# Patient Record
Sex: Female | Born: 1965
Health system: Southern US, Community
[De-identification: ages and names within clinical notes are randomized; demographics above are authoritative.]

## PROBLEM LIST (undated history)

## (undated) ENCOUNTER — Ambulatory Visit: Admission: EM | Discharge status: Absent without leave | Payer: BC Managed Care – PPO

## (undated) DIAGNOSIS — M1990 Unspecified osteoarthritis, unspecified site: Secondary | ICD-10-CM

## (undated) DIAGNOSIS — Z9889 Other specified postprocedural states: Secondary | ICD-10-CM

## (undated) DIAGNOSIS — G43909 Migraine, unspecified, not intractable, without status migrainosus: Secondary | ICD-10-CM

## (undated) HISTORY — PX: ABDOMINAL HYSTERECTOMY: SHX81

## (undated) HISTORY — PX: ABLATION: SHX5711

## (undated) HISTORY — PX: KNEE ARTHROSCOPY: SUR90

---

## 2009-03-04 ENCOUNTER — Other Ambulatory Visit: Admission: RE | Admit: 2009-03-04 | Discharge: 2009-03-04 | Payer: Self-pay | Admitting: Family Medicine

## 2010-06-23 ENCOUNTER — Ambulatory Visit (HOSPITAL_COMMUNITY): Admission: RE | Admit: 2010-06-23 | Discharge: 2010-06-23 | Payer: Self-pay | Admitting: Obstetrics and Gynecology

## 2011-01-27 LAB — COMPREHENSIVE METABOLIC PANEL
BUN: 7 mg/dL (ref 6–23)
CO2: 24 mEq/L (ref 19–32)
Chloride: 108 mEq/L (ref 96–112)
Creatinine, Ser: 0.79 mg/dL (ref 0.4–1.2)
GFR calc non Af Amer: >60 mL/min (ref >60)
Total Bilirubin: 0.5 mg/dL (ref 0.3–1.2)

## 2011-01-27 LAB — CBC
Hemoglobin: 11.6 g/dL — ABNORMAL LOW (ref 12.0–15.0)
MCH: 29.3 pg (ref 26.0–34.0)
MCV: 87.1 fL (ref 78.0–100.0)
RBC: 3.95 MIL/uL (ref 3.87–5.11)

## 2012-11-25 ENCOUNTER — Encounter: Payer: Self-pay | Admitting: Sports Medicine

## 2012-11-25 ENCOUNTER — Ambulatory Visit (INDEPENDENT_AMBULATORY_CARE_PROVIDER_SITE_OTHER): Payer: BC Managed Care – PPO | Admitting: Sports Medicine

## 2012-11-25 VITALS — BP 133/86 | HR 84 | Ht 65.0 in | Wt 170.0 lb

## 2012-11-25 DIAGNOSIS — M2240 Chondromalacia patellae, unspecified knee: Secondary | ICD-10-CM

## 2012-11-25 DIAGNOSIS — M25569 Pain in unspecified knee: Secondary | ICD-10-CM

## 2012-11-25 MED ORDER — TRAMADOL HCL 50 MG PO TABS: 50.0000 mg | ORAL_TABLET | Freq: Two times a day (BID) | ORAL | Status: DC | PRN

## 2012-11-25 MED ORDER — HYDROCODONE-ACETAMINOPHEN 5-500 MG PO TABS: 1.0000 | ORAL_TABLET | Freq: Every evening | ORAL | Status: DC | PRN

## 2012-11-25 NOTE — Progress Notes (Signed)
  Subjective:    Patient ID: Christina Montoya, female    DOB: 11-22-1965, 47 y.o.   MRN: [id number]  HPI chief complaint: Left knee pain  Patient is a 47 year old female whom I followed for years at [facility] for bilateral knee pain. She has a history of bilateral chondromalacia patella/patellofemoral DJD. He's been treated in the past with cortisone injections and Visco supplementation in each knee. Last Supartz injections were in April of this year into the right knee. Today she is complaining of left knee pain. Pain is identical in nature to what she's experienced previously. Worse with going up and down stairs or squatting. She has experienced swelling in the past but denies any recent swelling in either knee. No mechanical symptoms. I've given her prescriptions for both Ultram and Vicodin in the past. She uses these responsibly.  Medications include Zyrtec She is allergic to all NSAIDs, Septra, tetracycline Socially she does not smoke, drinks a glass of wine daily    Review of Systems     Objective:   Physical Exam Well-developed, well-nourished. No acute distress. Awake alert and oriented x3  Left knee: Full range of motion. No effusion. 2-3+ patellofemoral crepitus. Slight tenderness along the medial joint line but negative McMurray's. Positive patellar compression test. Good ligamentous stability. Neurovascularly intact distally. Right knee: Full range of motion. No effusion. Trace patellofemoral crepitus. Slight tenderness along the medial joint line but negative McMurray's. Mildly positive patellar compression test. Good ligamentous stability. Neurovascularly intact distally. She walks without a significant limp.      Assessment & Plan:  1. Left knee pain secondary to chondromalacia patella/patellofemoral DJD  Although the patient has done well with Visco supplementation in the past I informed her that her insurance will likely not cover any further injections.  This is do to a recent position statement by the [employer] recommending against Visco supplementation for treatment of osteoarthritis. Therefore, I have recommended a cortisone injection. I've also given her a prescription for Ultram 50 mg to take twice a day when necessary #60 with one refill and a prescription for Vicodin 5/500 to take for more severe pain, #40 with no refills. She will contact [employer] to see if further Visco supplementation will be covered. Followup with me when necessary  Consent obtained and verified. Time-out conducted. Noted no overlying erythema, induration, or other signs of local infection. Skin prepped in a sterile fashion. Topical analgesic spray: Ethyl chloride. Joint: left knee, anteriolateral approach Needle: 25g 1 1/2 inch Completed without difficulty. Meds: 3cc 0.5% marcaine, 1cc (40mg ) depomedrol  Advised to call if fevers/chills, erythema, induration, drainage, or persistent bleeding.

## 2013-01-01 ENCOUNTER — Ambulatory Visit (INDEPENDENT_AMBULATORY_CARE_PROVIDER_SITE_OTHER): Payer: BC Managed Care – PPO | Admitting: Sports Medicine

## 2013-01-01 ENCOUNTER — Other Ambulatory Visit: Payer: Self-pay | Admitting: Sports Medicine

## 2013-01-01 ENCOUNTER — Ambulatory Visit
Admission: RE | Admit: 2013-01-01 | Discharge: 2013-01-01 | Disposition: A | Discharge status: Home / Self Care | Payer: BC Managed Care – PPO | Source: Ambulatory Visit | Attending: Sports Medicine | Admitting: Sports Medicine

## 2013-01-01 ENCOUNTER — Encounter: Payer: Self-pay | Admitting: Sports Medicine

## 2013-01-01 VITALS — BP 137/92 | HR 89 | Ht 65.0 in | Wt 170.0 lb

## 2013-01-01 DIAGNOSIS — IMO0002 Reserved for concepts with insufficient information to code with codable children: Secondary | ICD-10-CM

## 2013-01-01 DIAGNOSIS — M25569 Pain in unspecified knee: Secondary | ICD-10-CM

## 2013-01-01 DIAGNOSIS — M1710 Unilateral primary osteoarthritis, unspecified knee: Secondary | ICD-10-CM

## 2013-01-01 NOTE — Progress Notes (Signed)
  Subjective:    Patient ID: Christina Montoya, female    DOB: 1966/02/13, 47 y.o.   MRN: [id number]  HPI  Patient comes in today with returning bilateral knee pain, left greater than right. Well documented history of chondromalacia patella/patellofemoral DJD. Symptoms began to return 3 weeks ago but her a little bit different in nature than what she is experienced previously. She is describing a burning discomfort that is in the retropatellar area and is present both with sitting as well as standing. No swelling. We have discussed the possibility of repeat Visco supplementation at her last office visit. She is interested in pursuing this. She has a history of bilateral lateral releases done by Dr. Thurston Hole in 2005. He was told at that time that she may eventually need patellofemoral arthroplasty. She is currently taking tramadol and hydrocodone as needed for pain but neither a very helpful. She is unable to take NSAIDs.  Interim medical history is unchanged since her last office visit    Review of Systems     Objective:   Physical Exam Well-developed, well-nourished. No acute distress. Awake alert and oriented x3  Left knee: Full range of motion. No effusion. 2+ patellofemoral crepitus with positive patellofemoral compression test. Negative McMurray's. Knee is stable to ligamentous exam. Neurovascularly intact distally.  Right knee: Full range of motion. No effusion. 2+ patellofemoral crepitus with positive patellofemoral compression test. Negative McMurray's. Knee is stable to ligamentous exam. Neurovascularly intact distally.  Walking with a slight limp      Assessment & Plan:  1. Bilateral knee pain, left greater than right secondary to chondromalacia patella/patellofemoral DJD  Patient was sent for updated x-rays of each knee. Followup next week to start another round of Visco supplementation.

## 2013-01-06 ENCOUNTER — Ambulatory Visit (INDEPENDENT_AMBULATORY_CARE_PROVIDER_SITE_OTHER): Payer: BC Managed Care – PPO | Admitting: Sports Medicine

## 2013-01-06 ENCOUNTER — Encounter: Payer: Self-pay | Admitting: Sports Medicine

## 2013-01-06 VITALS — BP 133/83 | HR 76 | Ht 65.0 in | Wt 170.0 lb

## 2013-01-06 DIAGNOSIS — IMO0002 Reserved for concepts with insufficient information to code with codable children: Secondary | ICD-10-CM

## 2013-01-06 DIAGNOSIS — M179 Osteoarthritis of knee, unspecified: Secondary | ICD-10-CM

## 2013-01-06 DIAGNOSIS — M2240 Chondromalacia patellae, unspecified knee: Secondary | ICD-10-CM

## 2013-01-06 DIAGNOSIS — M1710 Unilateral primary osteoarthritis, unspecified knee: Secondary | ICD-10-CM

## 2013-01-06 MED ORDER — CHLORZOXAZONE 500 MG PO TABS: 500.0000 mg | ORAL_TABLET | Freq: Three times a day (TID) | ORAL | Status: DC | PRN

## 2013-01-06 NOTE — Progress Notes (Signed)
  Subjective:    Patient ID: Christina Montoya, female    DOB: Mar 11, 1966, 48 y.o.   MRN: [id number]  HPI Patient comes in today to start a round of Supartz in her left knee. Recent x-rays of each of her knees showed some mild degenerative changes. She has done well with Supartz in the past.    Review of Systems     Objective:   Physical Exam Unchanged from her exam last week  X-rays of each of her knees are reviewed independently by myself. She has some mild medial compartmental narrowing as well as a slight lateral tilt of the patella. Findings are consistent with mild DJD.       Assessment & Plan:  1. Returning left knee pain secondary to DJD/chondromalacia patella  Patient's left knee is injected with her first Supartz injection. It is done using an anterior medial approach. She will followup next week for her second injection.  Of note, patient was complaining of a little bit of pain and spasm in the left trapezius. She's had a similar injury in the past which responded well to chlorzaxazone. I've given her a refill with instructions to take it 3 times a day when necessary.

## 2013-01-13 ENCOUNTER — Ambulatory Visit (INDEPENDENT_AMBULATORY_CARE_PROVIDER_SITE_OTHER): Payer: BC Managed Care – PPO | Admitting: Sports Medicine

## 2013-01-13 VITALS — BP 120/70 | Ht 65.0 in | Wt 170.0 lb

## 2013-01-13 DIAGNOSIS — M25562 Pain in left knee: Secondary | ICD-10-CM

## 2013-01-13 DIAGNOSIS — M25561 Pain in right knee: Secondary | ICD-10-CM

## 2013-01-13 DIAGNOSIS — IMO0002 Reserved for concepts with insufficient information to code with codable children: Secondary | ICD-10-CM

## 2013-01-13 DIAGNOSIS — M2240 Chondromalacia patellae, unspecified knee: Secondary | ICD-10-CM

## 2013-01-13 DIAGNOSIS — M1710 Unilateral primary osteoarthritis, unspecified knee: Secondary | ICD-10-CM

## 2013-01-13 DIAGNOSIS — M25569 Pain in unspecified knee: Secondary | ICD-10-CM

## 2013-01-13 DIAGNOSIS — M1712 Unilateral primary osteoarthritis, left knee: Secondary | ICD-10-CM

## 2013-01-14 NOTE — Progress Notes (Deleted)
  Subjective:    Patient ID: Christina Montoya, female    DOB: April 17, 1966, 47 y.o.   MRN: [id number]  HPI    Review of Systems     Objective:   Physical Exam        Assessment & Plan:

## 2013-01-14 NOTE — Progress Notes (Signed)
Patient ID: [name], female   DOB: May 31, 1966, 47 y.o.   MRN: [id number]   Christina Montoya comes in today for her second Supartz injection into her left knee. She tolerated the first one without any difficulty. She tells me that she has scheduled followup visits for her right knee as well so that we can complete the Supartz injection series for that knee in addition to starting the series on the left. Today however, we are only injecting the left knee.  Left knee exam is unchanged from last week's visit.  Impression/plan: 1. Left knee chondromalacia patella/mild DJD  Patient's left knee is injected with her second Supartz injection. This is done using an anterior medial approach. She tolerated this without difficulty. Followup next week for her third injection in this knee. Followup as scheduled to continue Supartz injections in the right knee as well. She would also like to try a body helix compression sleeve with prolonged walking. She has a double upright brace at home which is helpful but quite bulky.  Consent obtained and verified. Time-out conducted. Noted no overlying erythema, induration, or other signs of local infection. Skin prepped in a sterile fashion. Topical analgesic spray: Ethyl chloride. Joint: left knee Needle: 22g 11/2 inch Completed without difficulty. Meds: 3cc 1% lidocine followed by 1 vial of supartz  Advised to call if fevers/chills, erythema, induration, drainage, or persistent bleeding.

## 2013-01-20 ENCOUNTER — Encounter: Payer: Self-pay | Admitting: Sports Medicine

## 2013-01-20 ENCOUNTER — Ambulatory Visit (INDEPENDENT_AMBULATORY_CARE_PROVIDER_SITE_OTHER): Payer: BC Managed Care – PPO | Admitting: Sports Medicine

## 2013-01-20 ENCOUNTER — Ambulatory Visit: Payer: BC Managed Care – PPO | Admitting: Sports Medicine

## 2013-01-20 DIAGNOSIS — M2240 Chondromalacia patellae, unspecified knee: Secondary | ICD-10-CM

## 2013-01-20 DIAGNOSIS — M19049 Primary osteoarthritis, unspecified hand: Secondary | ICD-10-CM

## 2013-01-21 NOTE — Progress Notes (Signed)
Patient ID: Christina Montoya, female   DOB: [birth year], 46 y.o.   MRN: [id number]  Patient comes in today for her third Supartz injection into her left knee. She is are you noticing symptom relief. She voices no concerns.  Is good examination of the left knee is unchanged from last week's visit  Impression/plan:  1. Left knee pain secondary to chondromalacia patella/patellofemoral DJD  Patient's left knee is injected with her third Supartz injection. This was done atraumatically under sterile technique utilizing an anterior medial approach. She tolerated this without difficulty. She is scheduled to return to the office next week to start a round of Supartz in the right knee.  Consent obtained and verified. Time-out conducted. Noted no overlying erythema, induration, or other signs of local infection. Skin prepped in a sterile fashion. Topical analgesic spray: Ethyl chloride. Joint: left knee Needle: 25g 11/2 inch   Completed without difficulty. Meds: 3cc 1% lidocaine, 1cc (40mg ) depomedrol  Advised to call if fevers/chills, erythema, induration, drainage, or persistent bleeding.

## 2013-01-27 ENCOUNTER — Ambulatory Visit (INDEPENDENT_AMBULATORY_CARE_PROVIDER_SITE_OTHER): Payer: BC Managed Care – PPO | Admitting: Sports Medicine

## 2013-01-27 ENCOUNTER — Encounter: Payer: Self-pay | Admitting: Sports Medicine

## 2013-01-27 ENCOUNTER — Ambulatory Visit: Payer: BC Managed Care – PPO | Admitting: Sports Medicine

## 2013-01-27 VITALS — BP 134/70 | Ht 65.0 in | Wt 170.0 lb

## 2013-01-27 DIAGNOSIS — IMO0002 Reserved for concepts with insufficient information to code with codable children: Secondary | ICD-10-CM

## 2013-01-27 DIAGNOSIS — M2240 Chondromalacia patellae, unspecified knee: Secondary | ICD-10-CM

## 2013-01-27 DIAGNOSIS — M1710 Unilateral primary osteoarthritis, unspecified knee: Secondary | ICD-10-CM

## 2013-01-27 DIAGNOSIS — M1711 Unilateral primary osteoarthritis, right knee: Secondary | ICD-10-CM

## 2013-01-27 NOTE — Progress Notes (Signed)
  Subjective:    Patient ID: Christina Montoya, female    DOB: 1965/11/27, 47 y.o.   MRN: [id number]  HPI chief complaint: Right knee pain  Patient comes in today complaining of right knee pain. She has a history of mild DJD in patellofemoral chondromalacia. Her pain is diffuse and worse with activity such as going up and downstairs or squatting. No recent trauma. She has had good success with Visco supplementation in the past. We most recently injected her knee with cortisone which did temporarily help. Recent x-rays done in February showed some mild sclerosis of the medial tibia and lateral patella consistent with mild DJD. She has just completed a 3 shot Supartz series in the left knee and is doing well.    Review of Systems     Objective:   Physical Exam Well-developed, well-nourished. No acute distress. Awake alert and oriented x3  Right knee: Full range of motion. No effusion. Positive patellofemoral crepitus with a positive patellar compression test. Mild tenderness along the medial joint line but a negative McMurray's. No tenderness along the lateral joint line. Neurovascularly intact distally. Walking with out a limp       Assessment & Plan:  1. Returning right knee pain secondary to patellofemoral DJD/chondromalacia patella  Patient's right knee was injected with her first Supartz injection. This was done using an anterior medial approach. She tolerated this without difficulty. Followup next week for her second injection. Call with questions or concerns in the interim.  Consent obtained and verified. Time-out conducted. Noted no overlying erythema, induration, or other signs of local infection. Skin prepped in a sterile fashion. Topical analgesic spray: Ethyl chloride. Joint: Right knee Needle: 22g 11/2 inch Completed without difficulty. Meds: 3cc 1%lidocaine followed by 1 vial of supartz  Advised to call if fevers/chills, erythema, induration, drainage, or persistent  bleeding.

## 2013-02-03 ENCOUNTER — Ambulatory Visit (INDEPENDENT_AMBULATORY_CARE_PROVIDER_SITE_OTHER): Payer: BC Managed Care – PPO | Admitting: Sports Medicine

## 2013-02-03 ENCOUNTER — Ambulatory Visit: Payer: BC Managed Care – PPO | Admitting: Sports Medicine

## 2013-02-03 VITALS — BP 124/80 | Ht 65.0 in | Wt 170.0 lb

## 2013-02-03 DIAGNOSIS — M2240 Chondromalacia patellae, unspecified knee: Secondary | ICD-10-CM

## 2013-02-03 DIAGNOSIS — M1710 Unilateral primary osteoarthritis, unspecified knee: Secondary | ICD-10-CM

## 2013-02-03 DIAGNOSIS — IMO0002 Reserved for concepts with insufficient information to code with codable children: Secondary | ICD-10-CM

## 2013-02-03 NOTE — Progress Notes (Signed)
Patient ID: DONIQUE HAMMONDS, female   DOB: Apr 06, 1966, 47 y.o.   MRN: [id number]  Patient comes in today for her second Supartz injection into her right knee. She tolerated the first one without difficulty.  Physical exam is unchanged from last week's exam   Impression: Right knee pain secondary to mild DJD/chondromalacia patella  Patient's knee is injected with her second Supartz injection. This is done atraumatically under sterile technique utilizing an anterior medial approach. She tolerated this without difficulty. She'll followup next week for her third injection.  Consent obtained and verified. Time-out conducted. Noted no overlying erythema, induration, or other signs of local infection. Skin prepped in a sterile fashion. Topical analgesic spray: Ethyl chloride. Joint: right knee Needle: 22g 11/2 inch Completed without difficulty. Meds: 3cc 1%lidocaine followed by 1 vial of supartz  Advised to call if fevers/chills, erythema, induration, drainage, or persistent bleeding.

## 2013-02-10 ENCOUNTER — Ambulatory Visit (INDEPENDENT_AMBULATORY_CARE_PROVIDER_SITE_OTHER): Payer: BC Managed Care – PPO | Admitting: Sports Medicine

## 2013-02-10 ENCOUNTER — Encounter: Payer: Self-pay | Admitting: Sports Medicine

## 2013-02-10 DIAGNOSIS — M1711 Unilateral primary osteoarthritis, right knee: Secondary | ICD-10-CM

## 2013-02-10 DIAGNOSIS — M2240 Chondromalacia patellae, unspecified knee: Secondary | ICD-10-CM

## 2013-02-10 DIAGNOSIS — IMO0002 Reserved for concepts with insufficient information to code with codable children: Secondary | ICD-10-CM

## 2013-02-10 DIAGNOSIS — M1710 Unilateral primary osteoarthritis, unspecified knee: Secondary | ICD-10-CM

## 2013-02-10 NOTE — Progress Notes (Signed)
Patient ID: OMOLARA CAROL, female   DOB: Jan 02, 1966, 47 y.o.   MRN: [id number]  Patient comes in today for her third Supartz injection into her right knee. Tolerated the first 2 without any difficulty. Right knee pain is beginning to subside. She has no new complaints.  Physical exam is unchanged from previous visit.  Impression/plan  1. Right knee pain secondary to chondromalacia patella/mild DJD  Patient's knee is injected with her third Supartz injection. This is done utilizing an anterior medial approach. Patient can resume activity without restrictions and will followup when necessary.  Consent obtained and verified. Time-out conducted. Noted no overlying erythema, induration, or other signs of local infection. Skin prepped in a sterile fashion. Topical analgesic spray: Ethyl chloride. Joint: right knee Needle: 22g 11/2 inch Completed without difficulty. Meds: 3cc 1% lidocaine followed by 1 vial of supartz  Advised to call if fevers/chills, erythema, induration, drainage, or persistent bleeding.

## 2013-05-26 ENCOUNTER — Ambulatory Visit (INDEPENDENT_AMBULATORY_CARE_PROVIDER_SITE_OTHER): Payer: BC Managed Care – PPO | Admitting: Sports Medicine

## 2013-05-26 ENCOUNTER — Encounter: Payer: Self-pay | Admitting: Sports Medicine

## 2013-05-26 VITALS — BP 151/84 | HR 73 | Ht 65.0 in | Wt 165.0 lb

## 2013-05-26 DIAGNOSIS — M7050 Other bursitis of knee, unspecified knee: Secondary | ICD-10-CM

## 2013-05-26 DIAGNOSIS — M25569 Pain in unspecified knee: Secondary | ICD-10-CM

## 2013-05-26 DIAGNOSIS — M25562 Pain in left knee: Secondary | ICD-10-CM

## 2013-05-26 DIAGNOSIS — IMO0002 Reserved for concepts with insufficient information to code with codable children: Secondary | ICD-10-CM

## 2013-05-26 MED ORDER — METHYLPREDNISOLONE ACETATE 40 MG/ML IJ SUSP
40.0000 mg | Freq: Once | INTRAMUSCULAR | Status: AC
  Administered 2013-05-26: 40 mg via INTRA_ARTICULAR

## 2013-05-26 NOTE — Progress Notes (Signed)
  Subjective:    Patient ID: Christina Montoya, female    DOB: [birth year], 47 y.o.   MRN: [id number]  HPI chief complaint: Left knee pain  Patient comes in today complaining of 2 months of intermittent medial sided knee pain. She has a history of chondromalacia patella/patellofemoral DJD which is responded well in the past to Supartz injections. However, her current pain is different in nature to what she experienced previously. She denies any trauma but rather describes an insidious onset of pain and swelling that she localizes over the pes anserine bursa. Symptoms are worse with activity and improve at rest. No fevers or chills. She has not noticed any joint effusions. Denies any increase in activity. She has been wearing her body helix compression sleeve which does seem to help.  Interim medical history is unchanged    Review of Systems     Objective:   Physical Exam Well-developed, well-nourished. No acute distress. Awake alert and oriented x3  Left knee: Full range of motion. No effusion. There is tenderness as well as some mild swelling over the pes anserine bursa. No joint line tenderness to palpation. Negative McMurray's. Good joint stability. Neurovascularly intact distally. Walking with only a slight limp.       Assessment & Plan:  1. Left knee pain secondary to pes anserine bursitis  Patient's left pes anserine bursa was injected today after consent was obtained and risks including the risk of hypopigmentation were explained. She tolerated this without difficulty. I've given her a home exercise program. She will use ice daily. She has a body helix compression sleeve to wear as well. If symptoms persist I would consider merits of further diagnostic imaging. Followup for ongoing or recalcitrant issues.  Consent obtained and verified. Time-out conducted. Noted no overlying erythema, induration, or other signs of local infection. Skin prepped in a sterile fashion. Topical  analgesic spray: Ethyl chloride. Joint: left pes anserine bursa Needle: 25g 1 1/2 inch Completed without difficulty. Meds: 1cc (40mg ) depomedrol, 3cc 1% Xylocaine  Advised to call if fevers/chills, erythema, induration, drainage, or persistent bleeding.

## 2013-05-27 ENCOUNTER — Other Ambulatory Visit: Payer: Self-pay | Admitting: *Deleted

## 2013-05-27 MED ORDER — HYDROCODONE-ACETAMINOPHEN 5-500 MG PO TABS: 1.0000 | ORAL_TABLET | Freq: Four times a day (QID) | ORAL | Status: DC | PRN

## 2013-06-02 ENCOUNTER — Telehealth: Payer: Self-pay | Admitting: Sports Medicine

## 2013-06-02 ENCOUNTER — Other Ambulatory Visit: Payer: Self-pay | Admitting: *Deleted

## 2013-06-02 DIAGNOSIS — M25562 Pain in left knee: Secondary | ICD-10-CM

## 2013-06-02 NOTE — Telephone Encounter (Signed)
Message copied by Ralene Cork on Mon Jun 02, 2013 10:46 AM ------      Message from: Claiborne Billings      Created: Mon Jun 02, 2013  8:50 AM      Contact: [phone]       Still having complications with the left knee from last week.  Please call ------

## 2013-06-02 NOTE — Telephone Encounter (Signed)
Spoke with patient about her ongoing knee pain. Pes anserine bursa injection did not help and she is still struggling with pain and swelling in the medial knee. I think we should pursue an MRI of this knee before further treatment. In the meantime, I agreed to call her in a few hydrocodone last week to take for pain. I will f/u with her via telephone once I've reviewed her MRI.

## 2013-06-04 ENCOUNTER — Other Ambulatory Visit: Payer: BC Managed Care – PPO

## 2013-06-05 ENCOUNTER — Ambulatory Visit: Payer: BC Managed Care – PPO | Admitting: Sports Medicine

## 2013-08-18 ENCOUNTER — Ambulatory Visit (INDEPENDENT_AMBULATORY_CARE_PROVIDER_SITE_OTHER): Payer: BC Managed Care – PPO | Admitting: Sports Medicine

## 2013-08-18 VITALS — BP 147/89 | Ht 65.0 in | Wt 165.0 lb

## 2013-08-18 DIAGNOSIS — M25562 Pain in left knee: Secondary | ICD-10-CM

## 2013-08-18 DIAGNOSIS — M25569 Pain in unspecified knee: Secondary | ICD-10-CM

## 2013-08-18 MED ORDER — TRAMADOL HCL 50 MG PO TABS: 50.0000 mg | ORAL_TABLET | Freq: Two times a day (BID) | ORAL | Status: DC | PRN

## 2013-08-19 NOTE — Progress Notes (Signed)
  Subjective:    Patient ID: Christina Montoya, female    DOB: 09-04-1966, 47 y.o.   MRN: [id number]  HPI Patient comes in today with left knee pain. She was last seen in the office a few weeks ago where she was diagnosed with pes anserine bursitis. A cortisone injection administered at that time was not helpful. She then saw Dr. Thurston Hole who diagnosed her with insertional hamstring tendinitis and injected the area of the semi-tendinosis with cortisone. After a cortisone flare her pain did improve. Today's pain is mainly along her medial joint line. She has been trying some topical Voltaren which has been helpful. She's taking tramadol when necessary for pain which seems to be working as well. 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 She denies any swelling. No recent trauma. No real mechanical symptoms.    Review of Systems     Objective:   Physical Exam Well-developed, well-nourished. No acute distress  Left knee: Full range of motion. No effusion. There is tenderness to palpation along the medial joint line with pain but no popping with McMurray's maneuver. Minimal tenderness at the pes anserine bursa. Good joint stability. Neurovascularly intact distally. Walking with a minimal limp.       Assessment & Plan:  Left knee pain likely secondary to DJD/chondromalacia  I have recommended that for the time being the patient's take with her topical Voltaren and I will refill her Ultram to take as needed for more severe pain. I don't mind giving her a temporary three-month handicap placard and I'll plan on seeing her back in 3 months. We did discuss the possibility of an MRI a few weeks ago when she was having persistent left knee pain but at this point in time I think that we can wait on that. She will call me with questions or concerns prior to her followup visit.

## 2013-08-25 ENCOUNTER — Ambulatory Visit: Payer: BC Managed Care – PPO | Admitting: Sports Medicine

## 2013-09-08 ENCOUNTER — Other Ambulatory Visit: Payer: Self-pay | Admitting: *Deleted

## 2013-09-08 MED ORDER — CHLORZOXAZONE 500 MG PO TABS: 500.0000 mg | ORAL_TABLET | Freq: Three times a day (TID) | ORAL | Status: DC | PRN

## 2014-01-21 ENCOUNTER — Encounter: Payer: Self-pay | Admitting: Sports Medicine

## 2014-01-21 ENCOUNTER — Ambulatory Visit (INDEPENDENT_AMBULATORY_CARE_PROVIDER_SITE_OTHER): Payer: BC Managed Care – PPO | Admitting: Sports Medicine

## 2014-01-21 VITALS — BP 130/87 | Ht 65.0 in | Wt 158.0 lb

## 2014-01-21 DIAGNOSIS — M1710 Unilateral primary osteoarthritis, unspecified knee: Secondary | ICD-10-CM

## 2014-01-21 DIAGNOSIS — IMO0002 Reserved for concepts with insufficient information to code with codable children: Secondary | ICD-10-CM

## 2014-01-21 DIAGNOSIS — M2240 Chondromalacia patellae, unspecified knee: Secondary | ICD-10-CM

## 2014-01-21 DIAGNOSIS — M179 Osteoarthritis of knee, unspecified: Secondary | ICD-10-CM

## 2014-01-21 MED ORDER — CHLORZOXAZONE 500 MG PO TABS: 500.0000 mg | ORAL_TABLET | Freq: Three times a day (TID) | ORAL | Status: DC | PRN

## 2014-01-21 MED ORDER — TRAMADOL HCL 50 MG PO TABS: 50.0000 mg | ORAL_TABLET | Freq: Two times a day (BID) | ORAL | Status: DC | PRN

## 2014-01-21 NOTE — Progress Notes (Signed)
   Subjective:    Patient ID: Christina BirchwoodKathleen A Zulauf, female    DOB: 09/28/1966, 48 y.o.   MRN: [id number]  HPI Patient comes in today for followup on bilateral knee pain. Overall, she is doing very well. She has a documented history of bilateral chondromalacia patella/ patellofemoral DJD. She has had several cortisone injections and several rounds of Visco supplementation. Main reason for today's visit is medication refills. She takes tramadol, hydrocodone, and parafon on a when necessary basis. It has been several months since her last refills. She has become active in the gym. Today, she is without pain. Overall, she has lost about 70 pounds through diet and exercise.  Interim medical history is unchanged    Review of Systems     Objective:   Physical Exam Well-developed, well-nourished. No acute distress. Awake alert and oriented x3. Vital signs are reviewed  Examination of each knee shows full range of motion. One to 2+ patellofemoral crepitus bilaterally. No effusion. There is no tenderness to palpation. Negative McMurray's. Knees are stable to ligamentous exam. Neurovascularly intact distally.       Assessment & Plan:  Stable bilateral chondromalacia patella/ patellofemoral DJD  Patient is congratulated on her 70 pound weight loss. I refilled both her tramadol and her parafon. She does not currently need a refill on her hydrocodone. She will continue with her current workout program but she is cautioned about doing lunges and deep squatting. I've also given her some hip strengthening exercises to incorporate into her workout routine. Followup when necessary.

## 2014-02-03 ENCOUNTER — Other Ambulatory Visit: Payer: Self-pay | Admitting: *Deleted

## 2014-02-03 MED ORDER — HYDROCODONE-ACETAMINOPHEN 5-325 MG PO TABS: 1.0000 | ORAL_TABLET | Freq: Four times a day (QID) | ORAL | Status: DC | PRN

## 2014-02-03 MED ORDER — HYDROCODONE-ACETAMINOPHEN 5-325 MG PO TABS: 1.0000 | ORAL_TABLET | Freq: Two times a day (BID) | ORAL | Status: DC | PRN

## 2014-02-03 MED ORDER — HYDROCODONE-ACETAMINOPHEN 5-500 MG PO TABS: 1.0000 | ORAL_TABLET | Freq: Four times a day (QID) | ORAL | Status: DC | PRN

## 2014-02-03 NOTE — Progress Notes (Signed)
Rx re-written- patient only received one prescription for hydrocodone.

## 2014-06-18 ENCOUNTER — Other Ambulatory Visit: Payer: Self-pay | Admitting: *Deleted

## 2014-06-18 DIAGNOSIS — M25562 Pain in left knee: Principal | ICD-10-CM

## 2014-06-18 DIAGNOSIS — M25561 Pain in right knee: Secondary | ICD-10-CM

## 2014-06-18 MED ORDER — HYDROCODONE-ACETAMINOPHEN 5-325 MG PO TABS: 1.0000 | ORAL_TABLET | Freq: Two times a day (BID) | ORAL | Status: DC | PRN

## 2014-09-07 ENCOUNTER — Other Ambulatory Visit: Payer: Self-pay | Admitting: *Deleted

## 2014-09-07 MED ORDER — TRAMADOL HCL 50 MG PO TABS: 50.0000 mg | ORAL_TABLET | Freq: Two times a day (BID) | ORAL | Status: DC | PRN

## 2015-01-04 ENCOUNTER — Ambulatory Visit (INDEPENDENT_AMBULATORY_CARE_PROVIDER_SITE_OTHER): Payer: BLUE CROSS/BLUE SHIELD | Admitting: Sports Medicine

## 2015-01-04 ENCOUNTER — Encounter: Payer: Self-pay | Admitting: Sports Medicine

## 2015-01-04 VITALS — BP 147/81 | HR 76 | Ht 65.0 in | Wt 150.0 lb

## 2015-01-04 DIAGNOSIS — S93401A Sprain of unspecified ligament of right ankle, initial encounter: Secondary | ICD-10-CM | POA: Diagnosis not present

## 2015-01-04 DIAGNOSIS — M1712 Unilateral primary osteoarthritis, left knee: Secondary | ICD-10-CM

## 2015-01-04 DIAGNOSIS — M25562 Pain in left knee: Secondary | ICD-10-CM

## 2015-01-04 MED ORDER — SODIUM HYALURONATE (VISCOSUP) 25 MG/2.5ML IX SOSY
25.0000 mg | PREFILLED_SYRINGE | Freq: Once | INTRA_ARTICULAR | Status: AC
  Administered 2015-01-04: 25 mg via INTRA_ARTICULAR

## 2015-01-04 MED ORDER — TRAMADOL HCL 50 MG PO TABS
50.0000 mg | ORAL_TABLET | Freq: Two times a day (BID) | ORAL | Status: DC | PRN
Start: 2015-01-04 — End: 2015-06-21

## 2015-01-04 MED ORDER — HYDROCODONE-ACETAMINOPHEN 5-325 MG PO TABS
1.0000 | ORAL_TABLET | Freq: Two times a day (BID) | ORAL | Status: DC | PRN
Start: 2015-01-04 — End: 2015-01-26

## 2015-01-04 NOTE — Progress Notes (Signed)
Subjective:    Patient ID: Christina Montoya, female    DOB: 1965-11-26, 49 y.o.   MRN: [id number]  HPI chief complaint: Right ankle and left knee pain  Patient comes in today for repeat Supartz injections into her left knee. She has a history of chondromalacia patella/patellofemoral DJD in this left knee. Her last round of Visco supplementation was 2 years ago. She was doing very well up until a few weeks ago when she suffered an inversion injury to her right ankle. She was seen by Dr. Alfonso Ramus at [facility]. X-rays were negative for fracture per her report. She was initially placed into a Cam Walker and then transitioned into a med spec brace. However she is still limping a bit. She is now favoring the left knee due to her right ankle injury and as a result has aggravated her DJD. Symptoms are identical in nature to what she's experienced previously. She is also requesting refills on her pain medication. She also describes long-standing history of diffuse joint pain in her upper and lower extremities. She was questioning whether or not it was due to her Lasix as she has noted some improvement when she takes furosemide (she takes only on a when necessary basis). She has not noticed any joint swelling. It is primarily pain in her elbows and hands. She has had a rheumatologic workup in the past and many years ago was diagnosed with a dermatological form of lupus. However, she is not on any sort of rheumatologic agent. Interim medical history reviewed Allergies reviewed    Review of Systems     Objective:   Physical Exam Well-developed, well-nourished. No acute distress. Awake alert and oriented 3. Vital signs reviewed.  Left knee: Full range of motion. No effusion. 2+ patellofemoral crepitus. She is tender to palpation along the medial joint line but a negative McMurray's. Good joint stability. Neurovascular intact distally.  Right ankle: Limited range of motion in all planes.  Diffuse tenderness to palpation along the lateral ankle and foot. Neurovascularly intact distally. Walking with a slight limp.       Assessment & Plan:  1. Returning left knee pain secondary to chondromalacia patella/patellofemoral DJD 2. Right ankle sprain 3. Diffuse arthralgias of unknown etiology  Left knee is injected today with her first Supartz injection. This was done atraumatically under sterile technique utilizing an anterior medial approach. Flash of synovial fluid was seen prior to injection. She tolerated this without difficulty. I have refilled both her hydrocodone and tramadol. She takes both sparingly. For her right ankle sprain I have given her a body helix compression sleeve and she will start range of motion exercises at home. We will do some blood work including a CBC, CMP, sedimentation rate, C-reactive protein, ANA, rheumatoid factor, anti-CCP antibody, and HLA-B27. Patient will return to the office in one week for her second Supartz injection. I will schedule her to see Dr. Nori Riis in my absence. Patient will then follow-up with me one week later for her third injection.  Consent obtained and verified. Time-out conducted. Noted no overlying erythema, induration, or other signs of local infection. Skin prepped in a sterile fashion. Topical analgesic spray: Ethyl chloride. Joint: left knee Needle: 22g 1.5 inch Completed without difficulty. Meds: 3 cc 1% xylocaine. Syringe then removed but needle remained in place. One vial of Supartz was then attached to the needle and injected. A flash of synovial fluid was seen prior to injection of viscosupplementation.  Advised to call if fevers/chills, erythema,  induration, drainage, or persistent bleeding.

## 2015-01-05 ENCOUNTER — Other Ambulatory Visit (INDEPENDENT_AMBULATORY_CARE_PROVIDER_SITE_OTHER): Payer: BLUE CROSS/BLUE SHIELD

## 2015-01-05 DIAGNOSIS — M1712 Unilateral primary osteoarthritis, left knee: Secondary | ICD-10-CM

## 2015-01-05 LAB — CBC
HEMATOCRIT: 38.4 % (ref 36.0–46.0)
Hemoglobin: 12.6 g/dL (ref 12.0–15.0)
MCH: 30.4 pg (ref 26.0–34.0)
MCHC: 32.8 g/dL (ref 30.0–36.0)
MCV: 92.8 fL (ref 78.0–100.0)
MPV: 8.7 fL (ref 8.6–12.4)
Platelets: 298 10*3/uL (ref 150–400)
RBC: 4.14 MIL/uL (ref 3.87–5.11)
RDW: 13.2 % (ref 11.5–15.5)
WBC: 5.2 10*3/uL (ref 4.0–10.5)

## 2015-01-05 LAB — RHEUMATOID FACTOR: Rhuematoid fact SerPl-aCnc: <10 IU/mL (ref <=14)

## 2015-01-05 LAB — COMPLETE METABOLIC PANEL WITH GFR
ALBUMIN: 4.5 g/dL (ref 3.5–5.2)
ALT: 12 U/L (ref 0–35)
AST: 19 U/L (ref 0–37)
Alkaline Phosphatase: 48 U/L (ref 39–117)
BUN: 12 mg/dL (ref 6–23)
CHLORIDE: 100 meq/L (ref 96–112)
CO2: 27 mEq/L (ref 19–32)
Calcium: 9.5 mg/dL (ref 8.4–10.5)
Creat: 0.67 mg/dL (ref 0.50–1.10)
GFR, Est African American: >89 mL/min
Glucose, Bld: 77 mg/dL (ref 70–99)
Potassium: 4.3 mEq/L (ref 3.5–5.3)
SODIUM: 137 meq/L (ref 135–145)
Total Bilirubin: 0.5 mg/dL (ref 0.2–1.2)
Total Protein: 7.1 g/dL (ref 6.0–8.3)

## 2015-01-05 LAB — C-REACTIVE PROTEIN

## 2015-01-05 NOTE — Progress Notes (Signed)
[facility]om[facility] drew labs for Dr. Micheline Chapman at [facility]: HLA B-27,  ANA,  RF,  ANTI-CCP,  CBC,  CMET,  ESR,  CRP

## 2015-01-06 LAB — SEDIMENTATION RATE: SED RATE: 4 mm/h (ref 0–20)

## 2015-01-07 LAB — CYCLIC CITRUL PEPTIDE ANTIBODY, IGG: Cyclic Citrullin Peptide Ab: <2 U/mL (ref 0.0–5.0)

## 2015-01-07 LAB — ANA: Anti Nuclear Antibody(ANA): POSITIVE — AB

## 2015-01-07 LAB — ANTI-NUCLEAR AB-TITER (ANA TITER)

## 2015-01-08 LAB — HLA-B27 ANTIGEN: DNA RESULT: NOT DETECTED

## 2015-01-11 ENCOUNTER — Ambulatory Visit (INDEPENDENT_AMBULATORY_CARE_PROVIDER_SITE_OTHER): Payer: BLUE CROSS/BLUE SHIELD | Admitting: Family Medicine

## 2015-01-11 ENCOUNTER — Encounter: Payer: Self-pay | Admitting: Family Medicine

## 2015-01-11 VITALS — BP 140/71 | HR 70 | Ht 65.0 in | Wt 150.0 lb

## 2015-01-11 DIAGNOSIS — M25562 Pain in left knee: Secondary | ICD-10-CM | POA: Diagnosis not present

## 2015-01-11 DIAGNOSIS — M25561 Pain in right knee: Secondary | ICD-10-CM

## 2015-01-11 MED ORDER — SODIUM HYALURONATE (VISCOSUP) 25 MG/2.5ML IX SOSY
2.5000 mL | PREFILLED_SYRINGE | Freq: Once | INTRA_ARTICULAR | Status: AC
  Administered 2015-01-11: 2.5 mL via INTRA_ARTICULAR

## 2015-01-12 NOTE — Progress Notes (Signed)
Patient ID: Christina Montoya, female   DOB: 12/21/1965, 49 y.o.   MRN: [id number]  Christina Montoya - 49 y.o. female MRN [id number]  Date of birth: 09/01/1966    SUBJECTIVE:     Left knee pain. I have not seen her before (Dr. Gilmer Morraper's patient) She is here for second in this series of Supartz injections.  Has had osteoarthritis left knee for several years. Has had a series of Visco supplementation annually for most of the years in the last 3 or 4 years. Each time she gets significant amount of relief. Prior to starting this series, her last Visco supplementation was tears ago. She thinks she probably still would be doing well except she had an inversion injury of her ankle and think she strained her left knee at that time. Last week she had her first in this series of Supartz injection and tolerated it well without any problems. She is already starting to see some improvement, about 20-30%, in her pain and left knee with activity. ROS:     She's had no unusual swelling of the left knee, no erythema or warmth noted. No locking or giving way. She's had no fever, sweats, chills, unusual weight gain.  PERTINENT  PMH / PSH FH / / SH:  Past Medical, Surgical, Social, and Family History Reviewed & Updated in the EMR.  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 No personal history diabetes mellitus or hypertension. She does have history of being treated for anxious depression which seems stable at this time.  IMAGING: Left knee x-ray, 2014. Non-standing but there is a V with 30 flexion. The patellofemoral joint appears to have some arthritic spurring. The tibiofemoral joint is a little bit of sclerosis, the joint space appears well preserved, no cyst formation, very small amount of spurring.  OBJECTIVE: BP 140/71 mmHg  Pulse 70  Ht 5\' 5"  (1.651 m)  Wt 150 lb (68.04 kg)  BMI 24.96 kg/m2  Physical  Exam:  Vital signs are reviewed. GEN.: Well-developed female no acute distress KNEE: Left. Small amount of synovial hypertrophy is noted. She has full range of motion in flexion extension and it is painless. She is ligamentously intact to varus and valgus stress as well as Lockman and anterior drawer. Significant crepitus on extension. Pain with patellar grind. Medial joint line tenderness to palpation. I do not detect significant effusion.  SKIN: There is no erythema or warmth of the joint. Distally she has intact sensation to soft touch. EXT:  The calf is soft. Popliteal space is slightly full but no discrete mass.Marland Kitchen. VASCULAR: Dorsalis pedis pulse 2+. Normal cap refill.  INJECTION: Patient was given informed consent, signed copy in the chart. Appropriate time out was taken. Area prepped and draped in usual sterile fashion. 3 cc of  1% lidocaine without epinephrine was injected into the left knee using a(n) anterior medial approach. Leaving the 21-gauge needle in place in removing the syringe use of the lidocaine with sterile forceps, I then Luer locked the Supartz syringe onto the needle and injected into the knee without problem. ( The patient tolerated the procedure well. There were no complications. Post procedure instructions were given.  ASSESSMENT & PLAN:  See problem based charting & AVS for pt instructions.

## 2015-01-12 NOTE — Assessment & Plan Note (Signed)
Second in this series of Supartz injections performed today F/u 1 week

## 2015-01-18 ENCOUNTER — Encounter: Payer: Self-pay | Admitting: Sports Medicine

## 2015-01-18 ENCOUNTER — Ambulatory Visit (INDEPENDENT_AMBULATORY_CARE_PROVIDER_SITE_OTHER): Payer: BLUE CROSS/BLUE SHIELD | Admitting: Sports Medicine

## 2015-01-18 VITALS — BP 138/86 | HR 74 | Ht 65.0 in | Wt 150.0 lb

## 2015-01-18 DIAGNOSIS — M79671 Pain in right foot: Secondary | ICD-10-CM

## 2015-01-18 DIAGNOSIS — M1712 Unilateral primary osteoarthritis, left knee: Secondary | ICD-10-CM

## 2015-01-18 NOTE — Progress Notes (Signed)
   Subjective:    Patient ID: Christina BirchwoodKathleen A Montoya, female    DOB: 10/09/1966, 49 y.o.   MRN: [id number]  HPI  Patient comes in today for her third Supartz injection into her left knee. She tolerated the first 2 without difficulty. She is still having some slight discomfort but she thinks that it is due to her recent right ankle sprain. She continues to have pain and swelling not in the ankle but more so on the dorsum of the foot. She has been wearing a body helix compression sleeve which has helped but unfortunately the sleeve is starting to fall apart. She notices that the pain in her foot improves with her heel elevated and is worse with walking flat-footed.  I also discussed recent blood work with the patient. Her blood work did come back weekly positive for lupus but she has been told previously that she has a dermatologic form of this.    Review of Systems     Objective:   Physical Exam Well-developed, well-nourished. No acute distress.  Left knee: Full range of motion. 2+ patellofemoral crepitus. No effusion. No erythema.  Right foot: There is some slight soft tissue swelling along the anterior lateral portion of the midfoot. No tenderness to palpation at the Lisfranc joint. There is diffuse tenderness to palpation along the area of the cuboid. No tenderness to palpation at the distal fibula or medial malleolus. Good ankle stability. No joint effusion. Patient walks with a slight limp.       Assessment & Plan:  Left knee pain secondary to DJD/chondromalacia patella Persistent right foot pain status post ankle sprain  Patient's left knee was injected today with her third Supartz injection. This was accomplished using an anterior medial approach. Patient tolerated this without difficulty. I'm going to get follow-up films of her right foot just to make sure that she doesn't have a fracture. I will call her with those results once available. We discussed the possibility of physical therapy  if those x-rays are unremarkable. I have replaced her body helix compression sleeve. I recommended that she follow-up with her PCP regarding her positive ANA. It is only weakly positive and it may not need any sort of intervention but I want her to discuss this further with her PCP.  Consent obtained and verified. Time-out conducted. Noted no overlying erythema, induration, or other signs of local infection. Skin prepped in a sterile fashion. Topical analgesic spray: Ethyl chloride. Joint: left knee Needle: 22g 1.5 inch Completed without difficulty. Meds: 3cc 1% xylocaine, 1 vial Supartz  Advised to call if fevers/chills, erythema, induration, drainage, or persistent bleeding.

## 2015-01-19 ENCOUNTER — Other Ambulatory Visit: Payer: Self-pay | Admitting: *Deleted

## 2015-01-19 DIAGNOSIS — M25562 Pain in left knee: Secondary | ICD-10-CM

## 2015-01-19 MED ORDER — SODIUM HYALURONATE (VISCOSUP) 25 MG/2.5ML IX SOSY
25.0000 mg | PREFILLED_SYRINGE | Freq: Once | INTRA_ARTICULAR | Status: AC
  Administered 2015-01-18: 25 mg via INTRA_ARTICULAR

## 2015-01-22 ENCOUNTER — Ambulatory Visit
Admission: RE | Admit: 2015-01-22 | Discharge: 2015-01-22 | Disposition: A | Discharge status: Home / Self Care | Payer: BLUE CROSS/BLUE SHIELD | Source: Ambulatory Visit | Attending: Sports Medicine | Admitting: Sports Medicine

## 2015-01-22 DIAGNOSIS — M79671 Pain in right foot: Secondary | ICD-10-CM

## 2015-01-26 ENCOUNTER — Other Ambulatory Visit: Payer: Self-pay | Admitting: *Deleted

## 2015-01-26 ENCOUNTER — Telehealth: Payer: Self-pay | Admitting: *Deleted

## 2015-01-26 DIAGNOSIS — M1712 Unilateral primary osteoarthritis, left knee: Secondary | ICD-10-CM

## 2015-01-26 MED ORDER — HYDROCODONE-ACETAMINOPHEN 5-325 MG PO TABS: 1.0000 | ORAL_TABLET | Freq: Two times a day (BID) | ORAL | Status: DC | PRN

## 2015-01-26 MED ORDER — CETIRIZINE HCL 10 MG PO TABS: 10.0000 mg | ORAL_TABLET | Freq: Every day | ORAL | Status: AC

## 2015-01-26 NOTE — Telephone Encounter (Signed)
-----   Message from Ralene Corkimothy R Draper, DO sent at 01/25/2015 12:23 PM EDT ----- Regarding: xray results Please call and tell her that the x-rays of her foot are normal. No fracture. We had discussed physical therapy for her ankle sprain if the x-rays were normal. If she would like to proceed, go ahead and schedule with [facility]. Please forward this back to me so that I may dictate a note in the chart after you have spoke to her.  ----- Message -----    From: Rad Results In Interface    Sent: 01/22/2015  10:58 AM      To: Ralene Corkimothy R Draper, DO

## 2015-01-26 NOTE — Telephone Encounter (Signed)
Spoke with patient about her xray result.  She is going to call physical therapy to see what her cost is based on her insurance.  She will call us if she decides to proceed with therapy.

## 2015-03-19 ENCOUNTER — Other Ambulatory Visit: Payer: Self-pay | Admitting: *Deleted

## 2015-03-19 MED ORDER — CHLORZOXAZONE 500 MG PO TABS: 500.0000 mg | ORAL_TABLET | Freq: Three times a day (TID) | ORAL | Status: DC | PRN

## 2015-06-21 ENCOUNTER — Other Ambulatory Visit: Payer: Self-pay | Admitting: *Deleted

## 2015-06-21 DIAGNOSIS — M1712 Unilateral primary osteoarthritis, left knee: Secondary | ICD-10-CM

## 2015-06-21 MED ORDER — TRAMADOL HCL 50 MG PO TABS: 50.0000 mg | ORAL_TABLET | Freq: Two times a day (BID) | ORAL | Status: DC | PRN

## 2015-06-22 ENCOUNTER — Other Ambulatory Visit: Payer: Self-pay | Admitting: *Deleted

## 2015-06-22 DIAGNOSIS — M1712 Unilateral primary osteoarthritis, left knee: Secondary | ICD-10-CM

## 2016-02-01 ENCOUNTER — Other Ambulatory Visit: Payer: Self-pay | Admitting: *Deleted

## 2016-02-01 DIAGNOSIS — M1712 Unilateral primary osteoarthritis, left knee: Secondary | ICD-10-CM

## 2016-02-01 MED ORDER — TRAMADOL HCL 50 MG PO TABS: 50.0000 mg | ORAL_TABLET | Freq: Two times a day (BID) | ORAL | Status: DC | PRN

## 2016-02-02 MED FILL — traMADol HCL 50 MG TABS: 50 | 30 days supply | Qty: 60 | Fill #0

## 2016-03-01 MED FILL — traMADol HCL 50 MG TABS: 50 | 30 days supply | Qty: 60 | Fill #1

## 2016-04-26 DIAGNOSIS — L930 Discoid lupus erythematosus: Secondary | ICD-10-CM | POA: Diagnosis not present

## 2016-04-26 DIAGNOSIS — M1990 Unspecified osteoarthritis, unspecified site: Secondary | ICD-10-CM | POA: Diagnosis not present

## 2016-05-22 ENCOUNTER — Ambulatory Visit (INDEPENDENT_AMBULATORY_CARE_PROVIDER_SITE_OTHER): Payer: BLUE CROSS/BLUE SHIELD | Admitting: Sports Medicine

## 2016-05-22 ENCOUNTER — Encounter: Payer: Self-pay | Admitting: Sports Medicine

## 2016-05-22 VITALS — BP 123/85 | HR 66 | Ht 65.0 in | Wt 150.0 lb

## 2016-05-22 DIAGNOSIS — M1712 Unilateral primary osteoarthritis, left knee: Secondary | ICD-10-CM

## 2016-05-22 MED ORDER — TRAMADOL HCL (ER BIPHASIC) 100 MG PO CP24: 100.0000 mg | ORAL_CAPSULE | Freq: Every day | ORAL | Status: DC

## 2016-05-22 MED ORDER — CHLORZOXAZONE 500 MG PO TABS: 500.0000 mg | ORAL_TABLET | Freq: Three times a day (TID) | ORAL | Status: DC | PRN

## 2016-05-22 MED FILL — CHLORZOXAZONE 500 MG TABLET: 500 | 10 days supply | Qty: 30 | Fill #0

## 2016-05-22 MED FILL — traMADol HCL ER 100 MG TB24: 100 | 60 days supply | Qty: 60 | Fill #0

## 2016-05-22 NOTE — Progress Notes (Signed)
   Subjective:    Patient ID: Christina BirchwoodKathleen A Montoya, female    DOB: 08/29/1966, 50 y.o.   MRN: [id number]  HPI chief complaint: Left knee pain  50 year old patient with a known history of left knee chondromalacia patella/DJD comes in today with returning left knee pain. She has had several cortisone injections as well as several rounds of Visco supplementation. She was last seen in our office in March of last year. Her pain has returned but it is not as severe as it has been in the past. She states that tramadol has been effective. She has taken extended release tramadol in the past and is asking about possibly switching to this. She would also like a refill on her muscle relaxer. She denies any trauma. No swelling. Pain is worse with going up and down stairs. Also worse first thing in the morning.  Interim medical history reviewed Medications reviewed Allergies reviewed    Review of Systems    as above Objective:   Physical Exam  Well-developed, well-nourished. No acute distress  Left knee: Full range of motion. No effusion. 2+ patellofemoral crepitus. Slight pain with patellofemoral compression. Negative McMurray's. Knee is stable to ligamentous exam. Neurovascularly intact distally. Walking without a limp.      Assessment & Plan:   Returning left knee pain secondary to patellofemoral chondromalacia/DJD  I will give her a prescription for tramadol ER 100 mg to be taken daily as needed. I've also refilled her Parafon to take as needed for muscle spasm. We are going to wait on repeat injections for the time being. She can return to our office in the future for a repeat cortisone injection if she would like. Future Visco supplementation will need to be done at [facility].

## 2016-07-21 DIAGNOSIS — M542 Cervicalgia: Secondary | ICD-10-CM | POA: Diagnosis not present

## 2016-07-21 DIAGNOSIS — M549 Dorsalgia, unspecified: Secondary | ICD-10-CM | POA: Diagnosis not present

## 2016-08-15 MED FILL — traMADol HCL ER 100 MG TB24: 100 | 60 days supply | Qty: 60 | Fill #1

## 2016-08-15 MED FILL — CHLORZOXAZONE 500 MG TABLET: 500 | 10 days supply | Qty: 30 | Fill #1

## 2016-09-19 IMAGING — CR DG FOOT 2V*R*
2 series · 2 of 2 positions shown · non-contrast
Comparison: None.

CLINICAL DATA: Right foot lateral pain and swelling fourth and
fifth metatarsal, injury in November 2014

EXAM:
RIGHT FOOT - 2 VIEW

[w foot lat right]
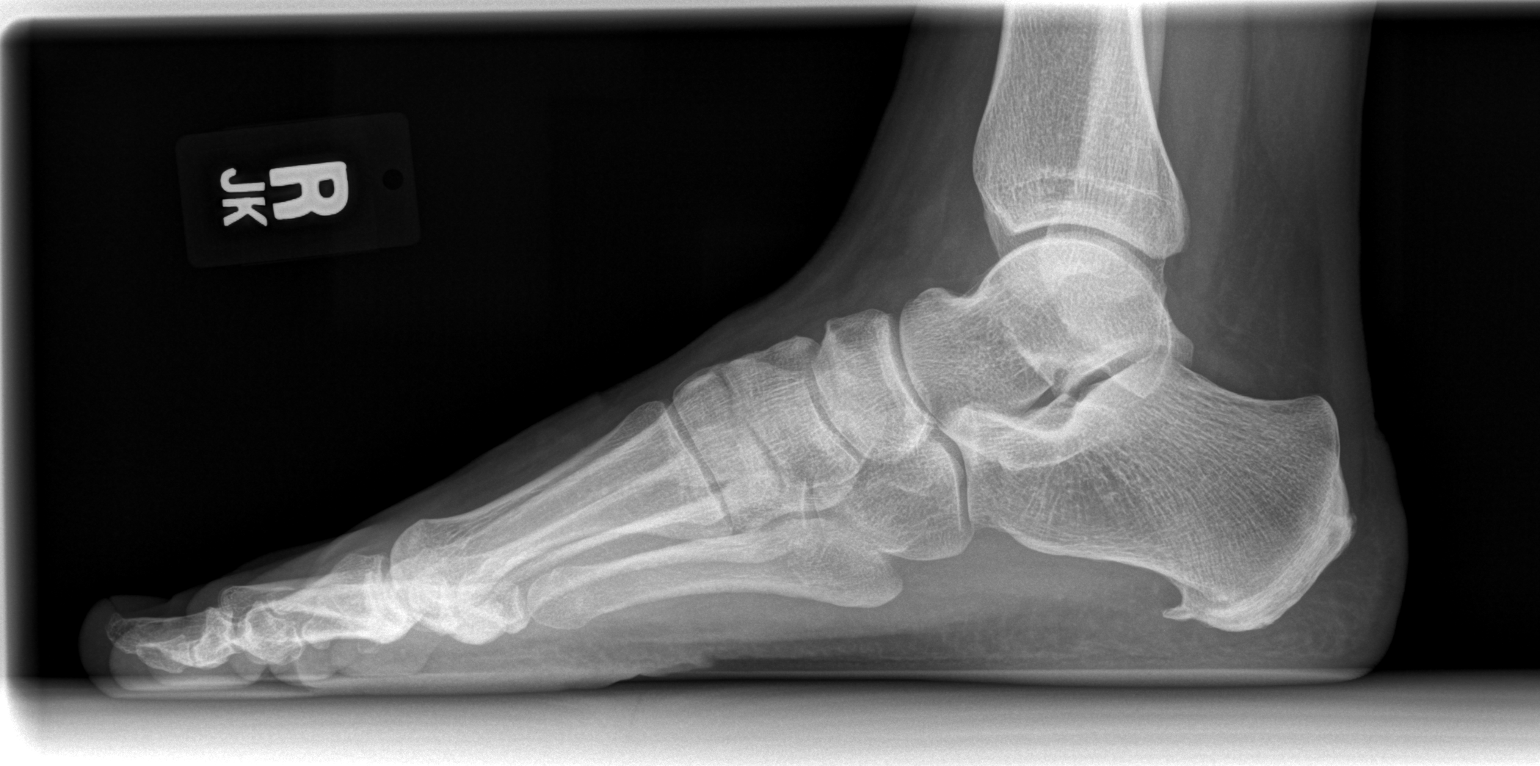

[view not recorded]
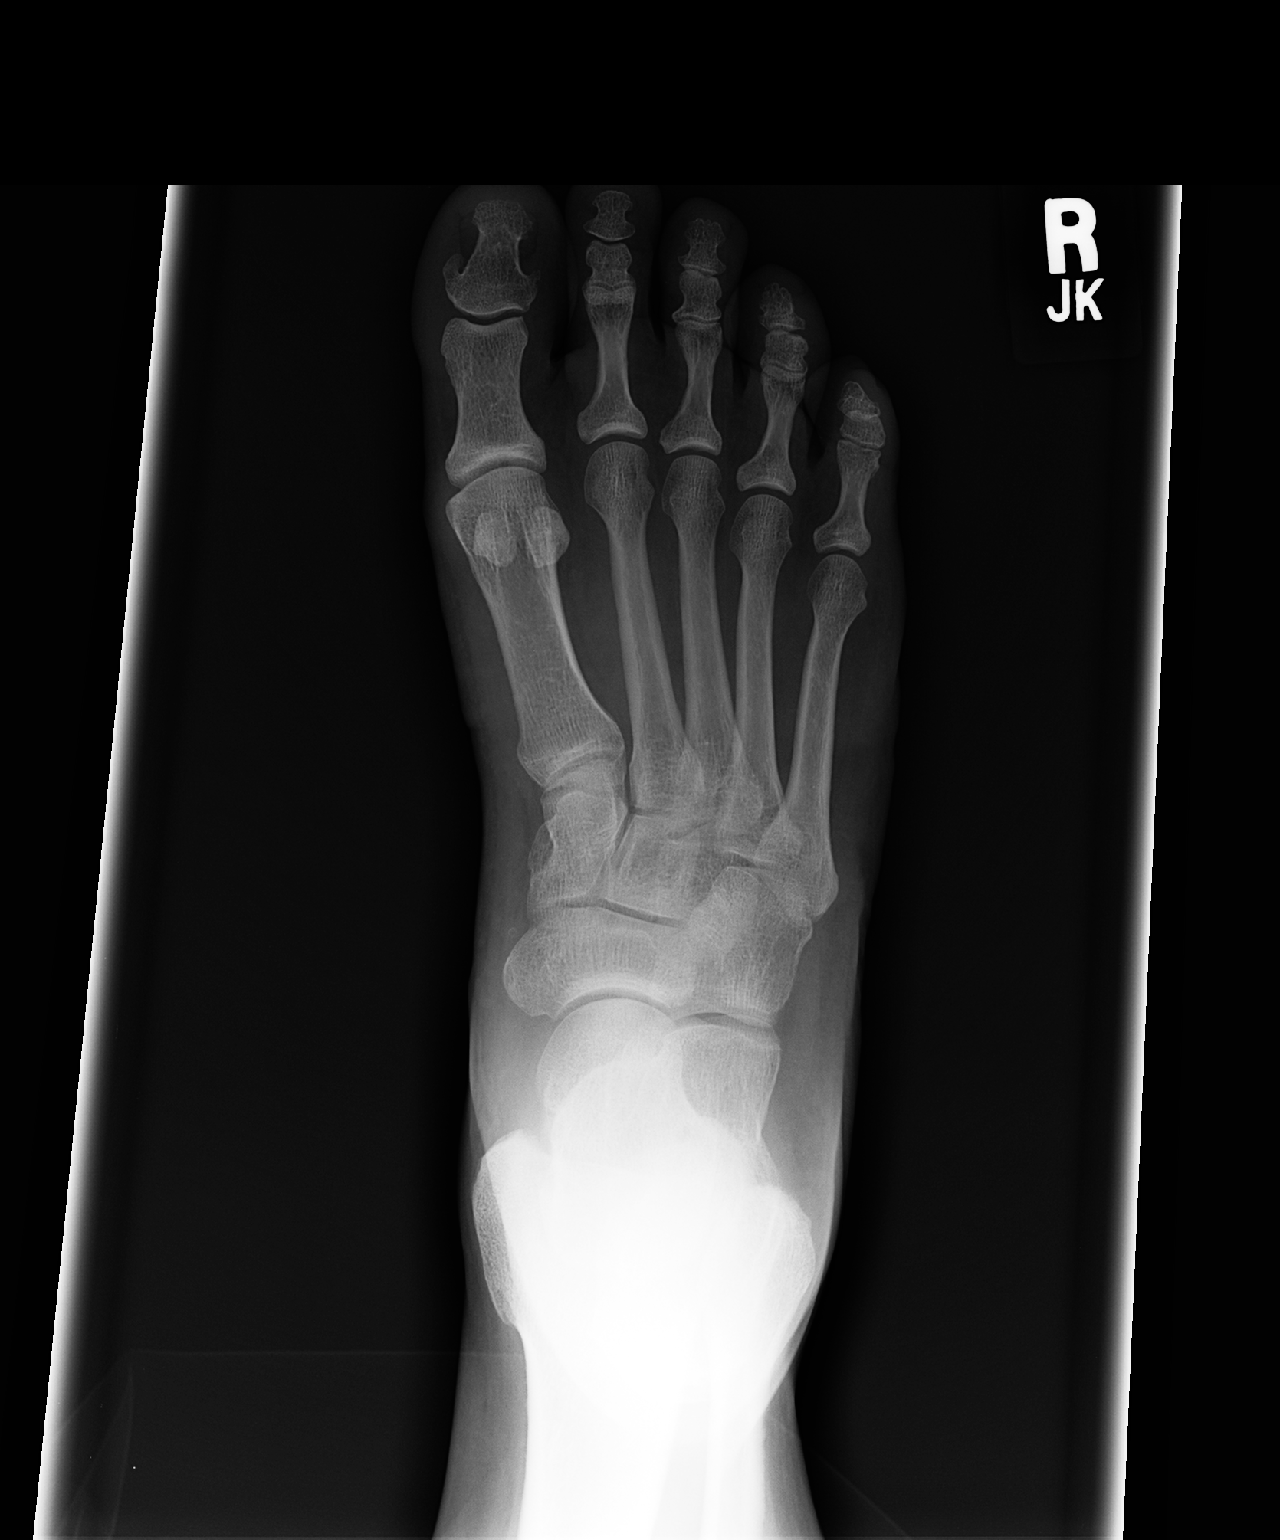

[2 of 2 positions shown; findings below may reference images not displayed]

FINDINGS: Two views of the right foot submitted. No acute fracture or
subluxation. There is plantar spur of calcaneus.
IMPRESSION: No acute fracture or subluxation.  Plantar spur of calcaneus.

## 2016-10-03 ENCOUNTER — Other Ambulatory Visit: Payer: Self-pay | Admitting: *Deleted

## 2016-10-03 MED ORDER — TRAMADOL HCL (ER BIPHASIC) 100 MG PO CP24: 100.0000 mg | ORAL_CAPSULE | Freq: Every day | ORAL | 1 refills | Status: DC | PRN

## 2016-10-13 MED FILL — traMADol HCL ER 100 MG TB24: 100 | 60 days supply | Qty: 60 | Fill #0

## 2016-10-30 DIAGNOSIS — Z0000 Encounter for general adult medical examination without abnormal findings: Secondary | ICD-10-CM | POA: Diagnosis not present

## 2016-12-10 MED FILL — traMADol HCL ER 100 MG TB24: 100 | 60 days supply | Qty: 60 | Fill #1

## 2017-01-29 ENCOUNTER — Ambulatory Visit (INDEPENDENT_AMBULATORY_CARE_PROVIDER_SITE_OTHER): Payer: BLUE CROSS/BLUE SHIELD | Admitting: Sports Medicine

## 2017-01-29 ENCOUNTER — Encounter: Payer: Self-pay | Admitting: Sports Medicine

## 2017-01-29 VITALS — BP 125/62

## 2017-01-29 DIAGNOSIS — M170 Bilateral primary osteoarthritis of knee: Secondary | ICD-10-CM

## 2017-01-29 MED ORDER — TRAMADOL HCL (ER BIPHASIC) 100 MG PO CP24: 100.0000 mg | ORAL_CAPSULE | Freq: Every day | ORAL | 2 refills | Status: DC | PRN

## 2017-01-29 NOTE — Progress Notes (Signed)
   Subjective:    Patient ID: Christina BirchwoodKathleen A Montoya, female    DOB: 04/18/1966, 51 y.o.   MRN: [id number]  HPI   Patient comes in today for follow-up on bilateral knee DJD. She is doing great. She takes a single 100 mg extended release tramadol daily and this alleviates her pain to the point to where she is able to stay active and exercise. She is very pleased with her response to the medicine. She has continued to lose weight. She has no new concerns today.    Review of Systems    as above Objective:   Physical Exam  Well-developed, well-nourished. No acute distress. Fit-appearing.  Examination of each knee shows full range of motion. No effusion. Good strength. 1-2+ patellofemoral crepitus bilaterally. Negative patellar grind. No joint line tenderness. Good ligamentous stability. Neurovascularly intact distally. Walking without a limp.      Assessment & Plan:   Stable bilateral knee DJD  Patient is taking her tramadol as directed. It has greatly increased her quality of life. I've given her 60 additional pills today with 2 refills. Follow-up with me in 6 months or sooner if needed.

## 2017-02-06 ENCOUNTER — Other Ambulatory Visit: Payer: Self-pay | Admitting: *Deleted

## 2017-02-06 MED ORDER — TRAMADOL HCL ER 100 MG PO TB24: 100.0000 mg | ORAL_TABLET | Freq: Every day | ORAL | 2 refills | Status: DC

## 2017-02-07 MED FILL — traMADol HCL ER 100 MG TB24: 100 | 60 days supply | Qty: 60 | Fill #0

## 2017-02-14 DIAGNOSIS — L710 Perioral dermatitis: Secondary | ICD-10-CM | POA: Diagnosis not present

## 2017-02-14 DIAGNOSIS — L700 Acne vulgaris: Secondary | ICD-10-CM | POA: Diagnosis not present

## 2017-04-03 ENCOUNTER — Encounter: Payer: Self-pay | Admitting: Family Medicine

## 2017-04-03 ENCOUNTER — Ambulatory Visit (INDEPENDENT_AMBULATORY_CARE_PROVIDER_SITE_OTHER): Payer: BLUE CROSS/BLUE SHIELD | Admitting: Family Medicine

## 2017-04-03 DIAGNOSIS — G8929 Other chronic pain: Secondary | ICD-10-CM

## 2017-04-03 DIAGNOSIS — M25561 Pain in right knee: Secondary | ICD-10-CM

## 2017-04-03 DIAGNOSIS — M25562 Pain in left knee: Secondary | ICD-10-CM | POA: Diagnosis not present

## 2017-04-03 DIAGNOSIS — S83281A Other tear of lateral meniscus, current injury, right knee, initial encounter: Secondary | ICD-10-CM | POA: Diagnosis not present

## 2017-04-05 NOTE — Assessment & Plan Note (Signed)
exam is reassuring.  Consistent with DJD.  We discussed tylenol, continuing her tramadol, topical medications.  Some supplements that may help (boswellia, pycnogenol, curcumin).  She requested vicodin - told her we do not prescribe that medication for this condition and she is to use the other medications noted above.  She was upset and told me she was informed I would prescribe this medicine for her.  She left the office before being provided instructions stating if I wouldn't provide the medication then there's no reason for her to be here.

## 2017-04-05 NOTE — Progress Notes (Signed)
PCP: Carilyn GoodpastureWillard, Jennifer, PA-C  Subjective:   HPI: Patient is a 51 y.o. female here for right knee pain.  Patient has longstanding history of knee arthritis. She has had injections previously. She takes tramadol daily for pain. Reports pain level is 4/10, anterior right knee. She stepped in a hole on 5/19 and twisted knee. States has had swelling but no bruising. [place] and reports she would like a script for Vicodin because she knows the knee will hurt more from walking there. No skin changes, numbness.  No past medical history on file.  Current Outpatient Prescriptions on File Prior to Visit  Medication Sig Dispense Refill  . ALPRAZolam (XANAX) 1 MG tablet   0  . amoxicillin-clavulanate (AUGMENTIN) 875-125 MG per tablet Take 1 tablet by mouth 2 (two) times daily. for 10 days  0  . cetirizine (ZYRTEC) 10 MG tablet Take 1 tablet (10 mg total) by mouth daily. 20 tablet 1  . chlorzoxazone (PARAFON) 500 MG tablet Take 1 tablet (500 mg total) by mouth 3 (three) times daily as needed for muscle spasms. 30 tablet 1  . chlorzoxazone (PARAFON) 500 MG tablet Take 1 tablet (500 mg total) by mouth 3 (three) times daily as needed for muscle spasms. 30 tablet 1  . HYDROcodone-acetaminophen (NORCO/VICODIN) 5-325 MG per tablet Take 1 tablet by mouth 2 (two) times daily as needed for moderate pain. 40 tablet 0  . rizatriptan (MAXALT-MLT) 10 MG disintegrating tablet   0  . sertraline (ZOLOFT) 100 MG tablet   0  . traMADol (ULTRAM-ER) 100 MG 24 hr tablet Take 1 tablet (100 mg total) by mouth daily. 60 tablet 2  . TraMADol HCl 100 MG CP24 Take 100 mg by mouth daily as needed. 60 capsule 2   No current facility-administered medications on file prior to visit.     No past surgical history on file.  Allergies  Allergen Reactions  . Nsaids Other (See Comments)    heartburn  . Septra [Sulfamethoxazole-Trimethoprim] Hives    Social History   Social History  . Marital  status: Single    Spouse name: N/A  . Number of children: N/A  . Years of education: N/A   Occupational History  . Not on file.   Social History Main Topics  . Smoking status: Former Smoker    Quit date: 11/14/1995  . Smokeless tobacco: Never Used  . Alcohol use Not on file  . Drug use: Unknown  . Sexual activity: Not on file   Other Topics Concern  . Not on file   Social History Narrative  . No narrative on file    No family history on file.  BP 116/76   Pulse 82   Ht 5\' 5"  (1.651 m)   Wt 150 lb (68 kg)   BMI 24.96 kg/m   Review of Systems: See HPI above.     Objective:  Physical Exam:  Gen: NAD, comfortable in exam room  Right knee: No gross deformity, ecchymoses, effusion. TTP medial, lateral joint lines and post patellar facets. FROM. Negative ant/post drawers. Negative valgus/varus testing. Negative lachmanns. Negative mcmurrays, apleys, patellar apprehension. NV intact distally.  Left knee: FROM without pain.   Assessment & Plan:  1. Right knee pain - exam is reassuring.  Consistent with DJD.  We discussed tylenol, continuing her tramadol, topical medications.  Some supplements that may help (boswellia, pycnogenol, curcumin).  She requested vicodin - told her we do not prescribe that medication for this  condition and she is to use the other medications noted above.  She was upset and told me she was informed I would prescribe this medicine for her.  She left the office before being provided instructions stating if I wouldn't provide the medication then there's no reason for her to be here.

## 2017-04-06 MED FILL — traMADol HCL ER 100 MG TB24: 100 | 60 days supply | Qty: 60 | Fill #1

## 2017-06-06 MED FILL — traMADol HCL ER 100 MG TB24: 100 | 60 days supply | Qty: 60 | Fill #2

## 2017-07-26 ENCOUNTER — Ambulatory Visit (INDEPENDENT_AMBULATORY_CARE_PROVIDER_SITE_OTHER): Payer: BLUE CROSS/BLUE SHIELD | Admitting: Sports Medicine

## 2017-07-26 VITALS — BP 124/80 | Ht 65.0 in | Wt 150.0 lb

## 2017-07-26 DIAGNOSIS — G8929 Other chronic pain: Secondary | ICD-10-CM

## 2017-07-26 DIAGNOSIS — M25561 Pain in right knee: Secondary | ICD-10-CM

## 2017-07-26 DIAGNOSIS — M25562 Pain in left knee: Secondary | ICD-10-CM

## 2017-07-26 MED ORDER — TRAMADOL HCL ER 100 MG PO TB24: 100.0000 mg | ORAL_TABLET | Freq: Every day | ORAL | 2 refills | Status: DC

## 2017-07-26 MED ORDER — CHLORZOXAZONE 500 MG PO TABS: 500.0000 mg | ORAL_TABLET | Freq: Three times a day (TID) | ORAL | 1 refills | Status: DC | PRN

## 2017-07-26 NOTE — Progress Notes (Signed)
   Subjective:    Patient ID: Adolphus BirchwoodKathleen A Castell, female    DOB: 09/05/1966, [age] y.o.   MRN: [id number]  HPI chief complaint: Bilateral knee pain  Patient comes in today for follow-up on bilateral knee pain. She has a history of bilateral knee DJD/chondromalacia patella. She's doing pretty well. Her symptoms are stable on extended release tramadol and Parafon. She does get occasional medial sided knee pain but no swelling. No mechanical symptoms. No recent trauma.  Interim medical history reviewed Medications reviewed Allergies reviewed   Review of Systems As above    Objective:   Physical Exam  Well-developed, well-nourished. No acute distress. Awake alert and oriented 3. Vital signs reviewed  Examination of both knees shows full range of motion. No effusion. 2+ patellofemoral crepitus on the left, 1+ patellofemoral crepitus on the right. No pain with patellar compression. Slight tenderness to palpation along the medial joint lines bilaterally but negative McMurray's. Good ligamentous stability. Neurovascularly intact distally. Walking without a limp.      Assessment & Plan:   Bilateral knee pain secondary to DJD/chondromalacia patella  Patient's symptoms are stable on extended release tramadol and Parafon. I will refill both of these medications for her. She is taking them responsibly. Follow-up in 6 months for reevaluation or sooner if needed.

## 2017-09-28 DIAGNOSIS — S61211D Laceration without foreign body of left index finger without damage to nail, subsequent encounter: Secondary | ICD-10-CM | POA: Diagnosis not present

## 2017-09-28 DIAGNOSIS — W268XXA Contact with other sharp object(s), not elsewhere classified, initial encounter: Secondary | ICD-10-CM | POA: Diagnosis not present

## 2017-10-26 DIAGNOSIS — L719 Rosacea, unspecified: Secondary | ICD-10-CM | POA: Diagnosis not present

## 2017-10-26 DIAGNOSIS — L710 Perioral dermatitis: Secondary | ICD-10-CM | POA: Diagnosis not present

## 2018-01-28 ENCOUNTER — Ambulatory Visit: Payer: BLUE CROSS/BLUE SHIELD | Admitting: Sports Medicine

## 2018-01-28 ENCOUNTER — Encounter: Payer: Self-pay | Admitting: Sports Medicine

## 2018-01-28 DIAGNOSIS — M1712 Unilateral primary osteoarthritis, left knee: Secondary | ICD-10-CM

## 2018-01-28 MED ORDER — HYDROCODONE-ACETAMINOPHEN 5-325 MG PO TABS: ORAL_TABLET | ORAL | 0 refills | Status: DC

## 2018-01-28 MED ORDER — TRAMADOL HCL ER 100 MG PO TB24: ORAL_TABLET | ORAL | 2 refills | Status: DC

## 2018-01-28 NOTE — Progress Notes (Signed)
  Patient comes in today to discuss her medications. She has a well-documented history of osteoarthritis, especially in her knees. She has been on hydrocodone, tramadol ER, and Parafon for years. She does very well on this regimen. She takes about 40 hydrocodone pills a year and has been taking her tramadol ER responsibly for several years. Her insurance company is requesting information regarding her usage of tramadol ER. Over the years, she has tried several other medications which were not helpful. She is unable to take NSAIDs. Her tramadol allows her to live her life well and continue to be active. Therefore, in my opinion, her tramadol ER is medically necessary for her osteoarthritis. I did give her a refill today on her Tamadol ER and hydrocodone today and I will refill her Parafon in the future when needed.  Total time spent with the patient was 15 minutes with greater than 50% of the time spent in face-to-face consultation discussing her current medication regimen for her osteoarthritis.

## 2018-02-05 DIAGNOSIS — Z1211 Encounter for screening for malignant neoplasm of colon: Secondary | ICD-10-CM | POA: Diagnosis not present

## 2018-02-05 DIAGNOSIS — F411 Generalized anxiety disorder: Secondary | ICD-10-CM | POA: Diagnosis not present

## 2018-02-14 DIAGNOSIS — Z1211 Encounter for screening for malignant neoplasm of colon: Secondary | ICD-10-CM | POA: Diagnosis not present

## 2018-07-22 ENCOUNTER — Ambulatory Visit: Payer: BLUE CROSS/BLUE SHIELD | Admitting: Sports Medicine

## 2018-07-23 ENCOUNTER — Encounter: Payer: Self-pay | Admitting: Sports Medicine

## 2018-07-23 ENCOUNTER — Ambulatory Visit: Payer: BLUE CROSS/BLUE SHIELD | Admitting: Sports Medicine

## 2018-07-23 VITALS — BP 110/70 | Ht 65.0 in | Wt 150.0 lb

## 2018-07-23 DIAGNOSIS — M25562 Pain in left knee: Secondary | ICD-10-CM | POA: Diagnosis not present

## 2018-07-23 DIAGNOSIS — G8929 Other chronic pain: Secondary | ICD-10-CM

## 2018-07-23 DIAGNOSIS — M1712 Unilateral primary osteoarthritis, left knee: Secondary | ICD-10-CM

## 2018-07-23 DIAGNOSIS — M25561 Pain in right knee: Secondary | ICD-10-CM | POA: Diagnosis not present

## 2018-07-23 MED ORDER — TRAMADOL HCL ER 100 MG PO TB24: ORAL_TABLET | ORAL | 2 refills | Status: DC

## 2018-07-23 MED ORDER — HYDROCODONE-ACETAMINOPHEN 5-325 MG PO TABS: ORAL_TABLET | ORAL | 0 refills | Status: DC

## 2018-07-24 ENCOUNTER — Encounter: Payer: Self-pay | Admitting: Sports Medicine

## 2018-07-24 NOTE — Progress Notes (Signed)
   Subjective:    Patient ID: Christina Montoya, female    DOB: 1966-06-04, 52 y.o.   MRN: [id number]  HPI Patient comes in today requesting refills on her medication. It has been 6 months since her last office visit. She takes tramadol ER, Parafon, and hydrocodone. She takes these medications responsibly. She has not had refills since her last office visit. She is due for updated x-rays on her knees. She has documented knee DJD. Overall, she is doing quite well. She remains active with minimal discomfort. No new injury.She's not noticed any swelling.   Review of Systems As above    Objective:   Physical Exam  Well-developed, well-nourished. No acute distress. Awake alert and oriented 3. Vital signs reviewed  Examination of both knees shows full range of motion. No effusion. 2+ patellofemoral crepitus with active extension. Slight tenderness to palpation along the medial joint lines bilaterally but a negative McMurrays. No tenderness laterally. Knees are stable to ligamentous exam. She is neurovascularly intact distally.      Assessment & Plan:   Bilateral knee DJD  Patient was given a refill on her tramadol ER. I also gave her a refill on her hydrocodone (20 pills with no additional refills). She will need to get updated imaging of both knees prior to her follow-up visit in 6 months. I will not be able to provide further refills without this updated imaging. In the meantime, I've encouraged her to continue to stay active.

## 2019-02-27 ENCOUNTER — Other Ambulatory Visit: Payer: Self-pay | Admitting: Sports Medicine

## 2019-04-09 DIAGNOSIS — H5213 Myopia, bilateral: Secondary | ICD-10-CM | POA: Diagnosis not present

## 2019-05-30 DIAGNOSIS — F411 Generalized anxiety disorder: Secondary | ICD-10-CM | POA: Diagnosis not present

## 2019-05-30 DIAGNOSIS — G4700 Insomnia, unspecified: Secondary | ICD-10-CM | POA: Diagnosis not present

## 2019-07-24 DIAGNOSIS — R51 Headache: Secondary | ICD-10-CM | POA: Diagnosis not present

## 2019-07-24 DIAGNOSIS — R112 Nausea with vomiting, unspecified: Secondary | ICD-10-CM | POA: Diagnosis not present

## 2019-07-24 DIAGNOSIS — R5381 Other malaise: Secondary | ICD-10-CM | POA: Diagnosis not present

## 2019-07-26 ENCOUNTER — Ambulatory Visit
Admission: EM | Admit: 2019-07-26 | Discharge: 2019-07-26 | Disposition: A | Discharge status: Home / Self Care | Payer: BC Managed Care – PPO | Attending: Emergency Medicine | Admitting: Emergency Medicine

## 2019-07-26 ENCOUNTER — Other Ambulatory Visit: Payer: Self-pay

## 2019-07-26 DIAGNOSIS — G44221 Chronic tension-type headache, intractable: Secondary | ICD-10-CM

## 2019-07-26 DIAGNOSIS — G43901 Migraine, unspecified, not intractable, with status migrainosus: Secondary | ICD-10-CM

## 2019-07-26 HISTORY — DX: Migraine, unspecified, not intractable, without status migrainosus: G43.909

## 2019-07-26 HISTORY — DX: Unspecified osteoarthritis, unspecified site: M19.90

## 2019-07-26 MED ORDER — CHLORZOXAZONE 500 MG PO TABS: 500.0000 mg | ORAL_TABLET | Freq: Three times a day (TID) | ORAL | 0 refills | Status: DC | PRN

## 2019-07-26 MED ORDER — DEXAMETHASONE SODIUM PHOSPHATE 10 MG/ML IJ SOLN
10.0000 mg | Freq: Once | INTRAMUSCULAR | Status: AC
  Administered 2019-07-26: 10 mg via INTRAMUSCULAR

## 2019-07-26 MED ORDER — KETOROLAC TROMETHAMINE 60 MG/2ML IM SOLN
60.0000 mg | Freq: Once | INTRAMUSCULAR | Status: AC
  Administered 2019-07-26: 60 mg via INTRAMUSCULAR

## 2019-07-26 MED ORDER — ONDANSETRON 4 MG PO TBDP: 4.0000 mg | ORAL_TABLET | Freq: Three times a day (TID) | ORAL | 0 refills | Status: DC | PRN

## 2019-07-26 NOTE — Discharge Instructions (Addendum)
Take nausea medication as needed: Do not exceed 3 doses in a 24. Avoid use muscle relaxer in conjunction with your Xanax as this can increase your risk of somnolence, fall. Follow-up with PCP regarding frequency of headaches. Go to ER if you develop worsening head pain, change in vision, loss of consciousness, unilateral weakness, facial droop.

## 2019-07-26 NOTE — ED Provider Notes (Signed)
[facility]    CSN: [id number] Arrival date & time: 07/26/19  1502      History   Chief Complaint No chief complaint on file.   HPI Christina Montoya is a 53 y.o. female with history of migraines without aura, tension headaches presenting for right posterior tension type headache for the last week.  Patient endorsing intermittent nausea with single episode of nonbilious, non-bloody emesis a few days ago; denies nausea today.  Patient states that she has tried several medications over the years: Typically her primary care will give her Vicodin, muscle relaxer for her migraines.  Denies worse negative life, thunderclap headache, symptoms that are alarming to her.  States that she tried taking Tylenol with mild relief, though since Wednesday it has become intractable.  Patient states she is been under a lot of social stress recently: Denies SI/HI.  States that she is compliant with her Zoloft, routine care with PCP.  Past Medical History:  Diagnosis Date  . Arthritis   . Migraines     Patient Active Problem List   Diagnosis Date Noted  . Knee pain 01/13/2013  . Knee pain, bilateral 01/13/2013    History reviewed. No pertinent surgical history.  OB History   No obstetric history on file.      Home Medications    Prior to Admission medications   Medication Sig Start Date End Date Taking? Authorizing Provider  ALPRAZolam Prudy Feeler(XANAX) 1 MG tablet  11/23/14   [provider]  cetirizine (ZYRTEC) 10 MG tablet Take 1 tablet (10 mg total) by mouth daily. 01/26/15   Ralene Corkraper, Timothy R, DO  chlorzoxazone (PARAFON) 500 MG tablet Take 1 tablet (500 mg total) by mouth 3 (three) times daily as needed for muscle spasms. 07/26/19   Hall-Potvin, [place], PA-C  ondansetron (ZOFRAN ODT) 4 MG disintegrating tablet Take 1 tablet (4 mg total) by mouth every 8 (eight) hours as needed for nausea or vomiting. 07/26/19   Hall-Potvin, [place], PA-C  sertraline (ZOLOFT) 100 MG tablet 150  mg.  12/06/14   [provider]    Family History Family History  Problem Relation Age of Onset  . Healthy Mother   . Anuerysm Father     Social History Social History   Tobacco Use  . Smoking status: Former Smoker    Quit date: 11/14/1995    Years since quitting: 23.7  . Smokeless tobacco: Never Used  Substance Use Topics  . Alcohol use: Yes    Comment: 1 glass a day  . Drug use: Not on file     Allergies   Nsaids and Septra [sulfamethoxazole-trimethoprim]   Review of Systems Review of Systems  Constitutional: Negative for fatigue and fever.  HENT: Negative for ear pain, sinus pressure, sinus pain, sore throat and voice change.   Eyes: Negative for pain, redness and visual disturbance.  Respiratory: Negative for cough and shortness of breath.   Cardiovascular: Negative for chest pain and palpitations.  Gastrointestinal: Negative for abdominal pain, diarrhea, nausea and vomiting.  Musculoskeletal: Negative for arthralgias and myalgias.  Skin: Negative for rash and wound.  Neurological: Positive for headaches. Negative for dizziness, tremors, syncope, facial asymmetry, speech difficulty, weakness, light-headedness and numbness.  Psychiatric/Behavioral: Negative for confusion and sleep disturbance.     Physical Exam Triage Vital Signs ED Triage Vitals  Enc Vitals Group     BP      Pulse      Resp      Temp  Temp src      SpO2      Weight      Height      Head Circumference      Peak Flow      Pain Score      Pain Loc      Pain Edu?      Excl. in [place]?    No data found.  Updated Vital Signs BP 125/83 (BP Location: Left Arm)   Pulse 75   Temp 98.8 F (37.1 C) (Oral)   Resp 16   SpO2 98%   Visual Acuity Right Eye Distance:   Left Eye Distance:   Bilateral Distance:    Right Eye Near:   Left Eye Near:    Bilateral Near:     Physical Exam Constitutional:      General: She is not in acute distress.    Appearance: She is normal  weight. She is not ill-appearing or diaphoretic.  HENT:     Head: Normocephalic and atraumatic.     Right Ear: Tympanic membrane, ear canal and external ear normal.     Left Ear: Tympanic membrane, ear canal and external ear normal.     Nose: No nasal deformity, congestion or rhinorrhea.     Mouth/Throat:     Mouth: Mucous membranes are moist.     Tongue: Tongue does not deviate from midline.     Pharynx: Oropharynx is clear. Uvula midline.     Comments: No tonsillar hypertrophy or exudate Eyes:     General: No scleral icterus.       Right eye: No discharge.        Left eye: No discharge.     Extraocular Movements: Extraocular movements intact.     Conjunctiva/sclera: Conjunctivae normal.     Pupils: Pupils are equal, round, and reactive to light.  Neck:     Musculoskeletal: Normal range of motion and neck supple. No muscular tenderness.  Cardiovascular:     Rate and Rhythm: Normal rate and regular rhythm.     Heart sounds: No murmur. No gallop.   Pulmonary:     Effort: Pulmonary effort is normal. No respiratory distress.     Breath sounds: No wheezing or rales.  Musculoskeletal: Normal range of motion.        General: No swelling.     Right lower leg: No edema.     Left lower leg: No edema.     Comments: Tender over right neck arch.  No spinous process tenderness  Lymphadenopathy:     Cervical: No cervical adenopathy.  Neurological:     General: No focal deficit present.     Mental Status: She is alert and oriented to person, place, and time.     Cranial Nerves: No cranial nerve deficit.     Sensory: No sensory deficit.     Motor: No weakness.     Coordination: Coordination normal.     Gait: Gait normal.     Deep Tendon Reflexes: Reflexes normal.  Psychiatric:        Mood and Affect: Mood normal.        Behavior: Behavior normal.        Thought Content: Thought content normal.        Judgment: Judgment normal.      UC Treatments / Results  Labs (all labs ordered  are listed, but only abnormal results are displayed) Labs Reviewed - No data to display  EKG   Radiology No  results found.  Procedures Procedures (including critical care time)  Medications Ordered in UC Medications  ketorolac (TORADOL) injection 60 mg (60 mg Intramuscular Given 07/26/19 1541)  dexamethasone (DECADRON) injection 10 mg (10 mg Intramuscular Given 07/26/19 1541)    Initial Impression / Assessment and Plan / UC Course  I have reviewed the triage vital signs and the nursing notes.  Pertinent labs & imaging results that were available during my care of the patient were reviewed by me and considered in my medical decision making (see chart for details).     1.  Chronic tension-type headache, intractable No neurocognitive deficits on exam, patient appears well.  Patient given IM Toradol, Decadron which she tolerated well.  Will provide short-term refill for her Parafon, Zofran which she states she tolerates well.  Patient follow-up with PCP, keep headache log in the interim.  Return precautions discussed, patient verbalized understanding and is agreeable to plan. Final Clinical Impressions(s) / UC Diagnoses   Final diagnoses:  Chronic tension-type headache, intractable     Discharge Instructions     Take nausea medication as needed: Do not exceed 3 doses in a 24. Avoid use muscle relaxer in conjunction with your Xanax as this can increase your risk of somnolence, fall. Follow-up with PCP regarding frequency of headaches. Go to ER if you develop worsening head pain, change in vision, loss of consciousness, unilateral weakness, facial droop.    ED Prescriptions    Medication Sig Dispense Auth. Provider   chlorzoxazone (PARAFON) 500 MG tablet  (Status: Discontinued) Take 1 tablet (500 mg total) by mouth 3 (three) times daily as needed for muscle spasms. 15 tablet Hall-Potvin, [place], PA-C   ondansetron (ZOFRAN ODT) 4 MG disintegrating tablet Take 1 tablet (4 mg  total) by mouth every 8 (eight) hours as needed for nausea or vomiting. 15 tablet Hall-Potvin, [place], PA-C   chlorzoxazone (PARAFON) 500 MG tablet Take 1 tablet (500 mg total) by mouth 3 (three) times daily as needed for muscle spasms. 15 tablet Hall-Potvin, [place], PA-C     Controlled Substance Prescriptions [place] Controlled Substance Registry consulted? Not Applicable   Shea Evans, [place] 07/26/19 1557

## 2019-07-26 NOTE — ED Triage Notes (Signed)
Pt presents to UC w/ c/o headahce on back of head on right side x1 week. Pt states she had some nausea and vomiting 2 days ago and states she is nauseaous at times. Pt reports having history of migraines that have been relieved by muscle relaxers and tylenol does not help the pain.

## 2019-08-15 DIAGNOSIS — Z20828 Contact with and (suspected) exposure to other viral communicable diseases: Secondary | ICD-10-CM | POA: Diagnosis not present

## 2020-01-22 ENCOUNTER — Ambulatory Visit: Payer: BC Managed Care – PPO

## 2020-06-29 ENCOUNTER — Other Ambulatory Visit: Payer: Self-pay

## 2020-06-29 ENCOUNTER — Ambulatory Visit: Payer: BC Managed Care – PPO | Admitting: Sports Medicine

## 2020-06-29 VITALS — BP 122/82 | Ht 65.0 in | Wt 180.0 lb

## 2020-06-29 DIAGNOSIS — M79641 Pain in right hand: Secondary | ICD-10-CM | POA: Diagnosis not present

## 2020-06-29 DIAGNOSIS — M79642 Pain in left hand: Secondary | ICD-10-CM

## 2020-06-29 DIAGNOSIS — M25579 Pain in unspecified ankle and joints of unspecified foot: Secondary | ICD-10-CM

## 2020-06-29 MED ORDER — TRAMADOL HCL 50 MG PO TABS: 50.0000 mg | ORAL_TABLET | Freq: Two times a day (BID) | ORAL | 0 refills | Status: DC | PRN

## 2020-06-29 MED ORDER — TRAMADOL HCL ER 100 MG PO TB24: 100.0000 mg | ORAL_TABLET | Freq: Every day | ORAL | 0 refills | Status: DC | PRN

## 2020-06-29 NOTE — Addendum Note (Signed)
Addended by: Rutha Bouchard E on: 06/29/2020 02:11 PM   Modules accepted: Orders

## 2020-06-29 NOTE — Patient Instructions (Signed)
Go get x-rays of your hands when you leave here. We are going to refer you to Dr. Dierdre Forth for further evaluation.  [facility] [address] [place], [place] [address] Phone [phone]  They will call you to schedule an appt.

## 2020-06-29 NOTE — Progress Notes (Addendum)
   Subjective:    Patient ID: Christina Montoya, female    DOB: [birth year], 54 y.o.   MRN: [id number]  HPI chief complaint: Joint pain and stiffness  Christina Montoya comes in today complaining of several months of diffuse joint pain and stiffness.  She has experienced the symptoms in the past.  She has a history of lupus and has seen rheumatology in the past but it has been about a decade.  I ordered some blood work in 2016 including a sed rate and C-reactive protein which were both negative.  She has taken tramadol for her pain which has been helpful but stopped the tramadol last year.  Her symptoms are worse first thing in the morning or when first getting up from a seated position.  She denies any swelling.  Acetaminophen and glucosamine have been ineffective.  She cannot take NSAIDs.  Past medical history reviewed Medications reviewed Allergies reviewed    Review of Systems    Above Objective:   Physical Exam  Well-developed, well-nourished.  No acute distress.  Sitting comfortably in the exam room  Examination of her hands and wrists show full range of motion.  There is no effusion.  No erythema.  Normal skin color. Examination of her hips, knees, and ankles show full range of motion.  No swelling.      Assessment & Plan:   Diffuse joint stiffness and pain-rule out seronegative arthropathy History of lupus  I would like to refer the patient to Dr. Dierdre Forth.  She may have some form of seronegative arthropathy.  In the meantime, I have agreed to refill her tramadol since that has helped in the past.  I will also get an AP x-ray of both hands and we will call her with those results.

## 2020-06-29 NOTE — Addendum Note (Signed)
Addended by: Rutha Bouchard E on: 06/29/2020 09:18 AM   Modules accepted: Orders

## 2020-07-23 DIAGNOSIS — M542 Cervicalgia: Secondary | ICD-10-CM | POA: Diagnosis not present

## 2020-07-23 DIAGNOSIS — R030 Elevated blood-pressure reading, without diagnosis of hypertension: Secondary | ICD-10-CM | POA: Diagnosis not present

## 2020-07-23 DIAGNOSIS — F411 Generalized anxiety disorder: Secondary | ICD-10-CM | POA: Diagnosis not present

## 2020-08-13 DIAGNOSIS — M7989 Other specified soft tissue disorders: Secondary | ICD-10-CM | POA: Diagnosis not present

## 2020-08-13 DIAGNOSIS — M2550 Pain in unspecified joint: Secondary | ICD-10-CM | POA: Diagnosis not present

## 2020-08-17 ENCOUNTER — Encounter: Payer: Self-pay | Admitting: Sports Medicine

## 2020-08-18 DIAGNOSIS — M1812 Unilateral primary osteoarthritis of first carpometacarpal joint, left hand: Secondary | ICD-10-CM | POA: Diagnosis not present

## 2020-11-19 DIAGNOSIS — H5201 Hypermetropia, right eye: Secondary | ICD-10-CM | POA: Diagnosis not present

## 2020-11-19 DIAGNOSIS — H52223 Regular astigmatism, bilateral: Secondary | ICD-10-CM | POA: Diagnosis not present

## 2020-11-19 DIAGNOSIS — H524 Presbyopia: Secondary | ICD-10-CM | POA: Diagnosis not present

## 2021-05-09 DIAGNOSIS — M545 Low back pain, unspecified: Secondary | ICD-10-CM | POA: Diagnosis not present

## 2021-05-09 DIAGNOSIS — M7062 Trochanteric bursitis, left hip: Secondary | ICD-10-CM | POA: Diagnosis not present

## 2021-06-06 DIAGNOSIS — M25552 Pain in left hip: Secondary | ICD-10-CM | POA: Diagnosis not present

## 2021-06-16 DIAGNOSIS — M25552 Pain in left hip: Secondary | ICD-10-CM | POA: Diagnosis not present

## 2021-06-20 DIAGNOSIS — M25552 Pain in left hip: Secondary | ICD-10-CM | POA: Diagnosis not present

## 2021-06-28 DIAGNOSIS — M5432 Sciatica, left side: Secondary | ICD-10-CM | POA: Diagnosis not present

## 2021-08-31 DIAGNOSIS — G43109 Migraine with aura, not intractable, without status migrainosus: Secondary | ICD-10-CM | POA: Diagnosis not present

## 2021-08-31 DIAGNOSIS — F411 Generalized anxiety disorder: Secondary | ICD-10-CM | POA: Diagnosis not present

## 2022-02-07 DIAGNOSIS — L719 Rosacea, unspecified: Secondary | ICD-10-CM | POA: Diagnosis not present

## 2022-02-07 DIAGNOSIS — H019 Unspecified inflammation of eyelid: Secondary | ICD-10-CM | POA: Diagnosis not present

## 2022-11-14 DIAGNOSIS — F32A Depression, unspecified: Secondary | ICD-10-CM | POA: Diagnosis not present

## 2022-11-14 DIAGNOSIS — Z23 Encounter for immunization: Secondary | ICD-10-CM | POA: Diagnosis not present

## 2022-11-14 DIAGNOSIS — F419 Anxiety disorder, unspecified: Secondary | ICD-10-CM | POA: Diagnosis not present

## 2022-12-06 DIAGNOSIS — M7542 Impingement syndrome of left shoulder: Secondary | ICD-10-CM | POA: Diagnosis not present

## 2023-02-18 ENCOUNTER — Ambulatory Visit
Admission: EM | Admit: 2023-02-18 | Discharge: 2023-02-18 | Disposition: A | Discharge status: Home / Self Care | Payer: BC Managed Care – PPO | Attending: Internal Medicine | Admitting: Internal Medicine

## 2023-02-18 VITALS — BP 139/84 | HR 78 | Temp 97.9°F | Resp 16

## 2023-02-18 DIAGNOSIS — M542 Cervicalgia: Secondary | ICD-10-CM | POA: Diagnosis not present

## 2023-02-18 DIAGNOSIS — M5030 Other cervical disc degeneration, unspecified cervical region: Secondary | ICD-10-CM | POA: Diagnosis not present

## 2023-02-18 DIAGNOSIS — S161XXA Strain of muscle, fascia and tendon at neck level, initial encounter: Secondary | ICD-10-CM

## 2023-02-18 MED ORDER — PREDNISONE 20 MG PO TABS: 40.0000 mg | ORAL_TABLET | Freq: Every day | ORAL | 0 refills | Status: AC

## 2023-02-18 NOTE — ED Provider Notes (Signed)
[facility]    CSN: [id number] Arrival date & time: 02/18/23  0956      History   Chief Complaint Chief Complaint  Patient presents with   Neck Injury    HPI Christina Montoya is a 57 y.o. female.   Patient presents with 10-day history of left-sided neck pain.  Patient reports that she had a video visit a few days prior and was prescribed tizanidine.  She reports that she did not tolerate this well as it caused her to have vision changes that have now resolved.  Reports that she has also been using ice and heat application and Voltaren gel with no improvement.  Patient states that she cannot take ibuprofen or Tylenol as it upsets her stomach.  She denies any numbness or tingling.  Pain does not radiate down the arm.  Denies any obvious injury.  Denies history of chronic neck pain.   Neck Injury    Past Medical History:  Diagnosis Date   Arthritis    Migraines     Patient Active Problem List   Diagnosis Date Noted   Knee pain 01/13/2013   Knee pain, bilateral 01/13/2013    History reviewed. No pertinent surgical history.  OB History   No obstetric history on file.      Home Medications    Prior to Admission medications   Medication Sig Start Date End Date Taking? Authorizing Provider  predniSONE (DELTASONE) 20 MG tablet Take 2 tablets (40 mg total) by mouth daily for 5 days. 02/18/23 02/23/23 Yes Guiliana Shor, Acie Fredrickson, FNP  sertraline (ZOLOFT) 100 MG tablet 150 mg.  12/06/14  Yes [provider]  ALPRAZolam Prudy Feeler) 1 MG tablet  11/23/14   [provider]  cetirizine (ZYRTEC) 10 MG tablet Take 1 tablet (10 mg total) by mouth daily. 01/26/15   Ralene Cork, DO  chlorzoxazone (PARAFON) 500 MG tablet Take 1 tablet (500 mg total) by mouth 3 (three) times daily as needed for muscle spasms. 07/26/19   Hall-Potvin, [place], PA-C  ondansetron (ZOFRAN ODT) 4 MG disintegrating tablet Take 1 tablet (4 mg total) by mouth every 8 (eight) hours as needed  for nausea or vomiting. 07/26/19   Hall-Potvin, [place], PA-C  traMADol (ULTRAM-ER) 100 MG 24 hr tablet Take 1 tablet (100 mg total) by mouth daily as needed for pain. 06/29/20   Ralene Cork, DO    Family History Family History  Problem Relation Age of Onset   Healthy Mother    Anuerysm Father     Social History Social History   Tobacco Use   Smoking status: Former    Types: Cigarettes    Quit date: 11/14/1995    Years since quitting: 27.2   Smokeless tobacco: Never  Substance Use Topics   Alcohol use: Yes    Comment: 1 glass a day     Allergies   Nsaids and Septra [sulfamethoxazole-trimethoprim]   Review of Systems Review of Systems Per HPI  Physical Exam Triage Vital Signs ED Triage Vitals  Enc Vitals Group     BP 02/18/23 1017 139/84     Pulse Rate 02/18/23 1017 78     Resp 02/18/23 1017 16     Temp 02/18/23 1017 97.9 F (36.6 C)     Temp Source 02/18/23 1017 Oral     SpO2 02/18/23 1017 95 %     Weight --      Height --      Head Circumference --  Peak Flow --      Pain Score 02/18/23 1019 8     Pain Loc --      Pain Edu? --      Excl. in [place]? --    No data found.  Updated Vital Signs BP 139/84 (BP Location: Left Arm)   Pulse 78   Temp 97.9 F (36.6 C) (Oral)   Resp 16   SpO2 95%   Visual Acuity Right Eye Distance:   Left Eye Distance:   Bilateral Distance:    Right Eye Near:   Left Eye Near:    Bilateral Near:     Physical Exam Constitutional:      General: She is not in acute distress.    Appearance: Normal appearance. She is not toxic-appearing or diaphoretic.  HENT:     Head: Normocephalic and atraumatic.  Eyes:     Extraocular Movements: Extraocular movements intact.     Conjunctiva/sclera: Conjunctivae normal.  Neck:     Comments: Patient has tenderness to palpation of the left lateral portion of the neck.  There is no direct spinal tenderness, crepitus, step-off noted.  No discoloration noted.  Patient has pain with  rotation of the neck to the left.  Has full range of motion of upper extremity. Pulmonary:     Effort: Pulmonary effort is normal.  Neurological:     General: No focal deficit present.     Mental Status: She is alert and oriented to person, place, and time. Mental status is at baseline.  Psychiatric:        Mood and Affect: Mood normal.        Behavior: Behavior normal.        Thought Content: Thought content normal.        Judgment: Judgment normal.      UC Treatments / Results  Labs (all labs ordered are listed, but only abnormal results are displayed) Labs Reviewed - No data to display  EKG   Radiology No results found.  Procedures Procedures (including critical care time)  Medications Ordered in UC Medications - No data to display  Initial Impression / Assessment and Plan / UC Course  I have reviewed the triage vital signs and the nursing notes.  Pertinent labs & imaging results that were available during my care of the patient were reviewed by me and considered in my medical decision making (see chart for details).     Physical exam is consistent with neck muscle strain.  Given no direct spinal tenderness or injury, will defer imaging.  Patient has already been taking muscle relaxer.  She requested Soma and tramadol for symptoms.  Discussed with patient limitations and restrictions of these medications.  Patient was agreeable to prednisone. It does not appear that she takes prednisone routinely. She has taken this before and tolerated well.  No obvious contraindications to steroids noted in patient's history.  Advised follow-up with her established orthopedist if symptoms persist or worsen.  Patient verbalized understanding and was agreeable with plan. Final Clinical Impressions(s) / UC Diagnoses   Final diagnoses:  Neck strain, initial encounter  Neck pain     Discharge Instructions      I have prescribed you prednisone to decrease inflammation associated with  neck strain.  Follow-up with your orthopedist if symptoms persist or worsen.    ED Prescriptions     Medication Sig Dispense Auth. Provider   predniSONE (DELTASONE) 20 MG tablet Take 2 tablets (40 mg total) by mouth daily  for 5 days. 10 tablet Gustavus Bryant, [place]      PDMP not reviewed this encounter.   Gustavus Bryant, [place] 02/18/23 1057

## 2023-02-18 NOTE — Discharge Instructions (Signed)
I have prescribed you prednisone to decrease inflammation associated with neck strain.  Follow-up with your orthopedist if symptoms persist or worsen.

## 2023-02-18 NOTE — ED Triage Notes (Signed)
10- day history of right side neck pain. Pt reports she was taking muscle relaxer however they have not help.

## 2024-02-01 ENCOUNTER — Encounter (HOSPITAL_COMMUNITY): Payer: Self-pay

## 2024-02-01 ENCOUNTER — Encounter (HOSPITAL_COMMUNITY): Payer: Self-pay | Admitting: *Deleted

## 2024-02-01 ENCOUNTER — Inpatient Hospital Stay (HOSPITAL_COMMUNITY)
Admission: EM | Admit: 2024-02-01 | Discharge: 2024-02-04 | DRG: 399 | Disposition: A | Discharge status: Home / Self Care | Attending: General Surgery | Admitting: General Surgery

## 2024-02-01 ENCOUNTER — Ambulatory Visit (HOSPITAL_COMMUNITY)
Admission: EM | Admit: 2024-02-01 | Discharge: 2024-02-01 | Disposition: A | Discharge status: Home / Self Care | Attending: Family Medicine | Admitting: Family Medicine

## 2024-02-01 ENCOUNTER — Emergency Department (HOSPITAL_COMMUNITY)

## 2024-02-01 ENCOUNTER — Other Ambulatory Visit: Payer: Self-pay

## 2024-02-01 DIAGNOSIS — R1031 Right lower quadrant pain: Secondary | ICD-10-CM

## 2024-02-01 DIAGNOSIS — M1990 Unspecified osteoarthritis, unspecified site: Secondary | ICD-10-CM | POA: Diagnosis not present

## 2024-02-01 DIAGNOSIS — Z79899 Other long term (current) drug therapy: Secondary | ICD-10-CM | POA: Diagnosis not present

## 2024-02-01 DIAGNOSIS — Z886 Allergy status to analgesic agent status: Secondary | ICD-10-CM

## 2024-02-01 DIAGNOSIS — Z888 Allergy status to other drugs, medicaments and biological substances status: Secondary | ICD-10-CM

## 2024-02-01 DIAGNOSIS — Z882 Allergy status to sulfonamides status: Secondary | ICD-10-CM

## 2024-02-01 DIAGNOSIS — Z90711 Acquired absence of uterus with remaining cervical stump: Secondary | ICD-10-CM

## 2024-02-01 DIAGNOSIS — I10 Essential (primary) hypertension: Secondary | ICD-10-CM | POA: Diagnosis not present

## 2024-02-01 DIAGNOSIS — G43909 Migraine, unspecified, not intractable, without status migrainosus: Secondary | ICD-10-CM | POA: Diagnosis present

## 2024-02-01 DIAGNOSIS — K3580 Unspecified acute appendicitis: Secondary | ICD-10-CM | POA: Diagnosis present

## 2024-02-01 DIAGNOSIS — K35201 Acute appendicitis with generalized peritonitis, with perforation, without abscess: Secondary | ICD-10-CM | POA: Diagnosis not present

## 2024-02-01 DIAGNOSIS — K353 Acute appendicitis with localized peritonitis, without perforation or gangrene: Principal | ICD-10-CM

## 2024-02-01 DIAGNOSIS — Z87891 Personal history of nicotine dependence: Secondary | ICD-10-CM | POA: Diagnosis not present

## 2024-02-01 DIAGNOSIS — Z881 Allergy status to other antibiotic agents status: Secondary | ICD-10-CM | POA: Diagnosis not present

## 2024-02-01 DIAGNOSIS — Z90710 Acquired absence of both cervix and uterus: Secondary | ICD-10-CM | POA: Diagnosis not present

## 2024-02-01 DIAGNOSIS — K3532 Acute appendicitis with perforation and localized peritonitis, without abscess: Principal | ICD-10-CM | POA: Diagnosis present

## 2024-02-01 DIAGNOSIS — K381 Appendicular concretions: Secondary | ICD-10-CM | POA: Diagnosis not present

## 2024-02-01 HISTORY — DX: Other specified postprocedural states: Z98.890

## 2024-02-01 LAB — COMPREHENSIVE METABOLIC PANEL
ALT: 27 U/L (ref 0–44)
AST: 21 U/L (ref 15–41)
Albumin: 4.1 g/dL (ref 3.5–5.0)
Alkaline Phosphatase: 66 U/L (ref 38–126)
Anion gap: 13 (ref 5–15)
BUN: 15 mg/dL (ref 6–20)
CO2: 23 mmol/L (ref 22–32)
Calcium: 9.9 mg/dL (ref 8.9–10.3)
Chloride: 100 mmol/L (ref 98–111)
Creatinine, Ser: 0.92 mg/dL (ref 0.44–1.00)
GFR, Estimated: >60 mL/min (ref >60)
Glucose, Bld: 100 mg/dL — ABNORMAL HIGH (ref 70–99)
Potassium: 4.1 mmol/L (ref 3.5–5.1)
Sodium: 136 mmol/L (ref 135–145)
Total Bilirubin: 1.3 mg/dL — ABNORMAL HIGH (ref 0.0–1.2)
Total Protein: 7.3 g/dL (ref 6.5–8.1)

## 2024-02-01 LAB — POCT URINALYSIS DIP (MANUAL ENTRY)
Glucose, UA: NEGATIVE mg/dL
Ketones, POC UA: NEGATIVE mg/dL
Nitrite, UA: NEGATIVE
Spec Grav, UA: >=1.03 — AB (ref 1.010–1.025)
Urobilinogen, UA: 0.2 U/dL
pH, UA: 5.5 (ref 5.0–8.0)

## 2024-02-01 LAB — CBC
HCT: 44.4 % (ref 36.0–46.0)
Hemoglobin: 14.2 g/dL (ref 12.0–15.0)
MCH: 30.8 pg (ref 26.0–34.0)
MCHC: 32 g/dL (ref 30.0–36.0)
MCV: 96.3 fL (ref 80.0–100.0)
Platelets: 242 10*3/uL (ref 150–400)
RBC: 4.61 MIL/uL (ref 3.87–5.11)
RDW: 12.8 % (ref 11.5–15.5)
WBC: 10.3 10*3/uL (ref 4.0–10.5)
nRBC: 0 % (ref 0.0–0.2)

## 2024-02-01 LAB — TROPONIN I (HIGH SENSITIVITY): Troponin I (High Sensitivity): 6 ng/L (ref <18)

## 2024-02-01 LAB — LIPASE, BLOOD: Lipase: 32 U/L (ref 11–51)

## 2024-02-01 LAB — HIV ANTIBODY (ROUTINE TESTING W REFLEX): HIV Screen 4th Generation wRfx: NONREACTIVE

## 2024-02-01 MED ORDER — MORPHINE SULFATE (PF) 4 MG/ML IV SOLN
4.0000 mg | Freq: Once | INTRAVENOUS | Status: AC
  Administered 2024-02-01: 4 mg via INTRAVENOUS
  Filled 2024-02-01: qty 1

## 2024-02-01 MED ORDER — MORPHINE SULFATE (PF) 2 MG/ML IV SOLN
4.0000 mg | Freq: Once | INTRAVENOUS | Status: AC
  Administered 2024-02-01: 4 mg via INTRAVENOUS
  Filled 2024-02-01: qty 2

## 2024-02-01 MED ORDER — ACETAMINOPHEN 500 MG PO TABS
1000.0000 mg | ORAL_TABLET | Freq: Four times a day (QID) | ORAL | Status: DC
  Administered 2024-02-01 – 2024-02-04 (×9): 1000 mg via ORAL
  Filled 2024-02-01 (×9): qty 2

## 2024-02-01 MED ORDER — OXYCODONE HCL 5 MG PO TABS
5.0000 mg | ORAL_TABLET | ORAL | Status: DC | PRN
  Administered 2024-02-01 – 2024-02-02 (×2): 10 mg via ORAL
  Administered 2024-02-02: 5 mg via ORAL
  Administered 2024-02-03 (×2): 10 mg via ORAL
  Administered 2024-02-03: 5 mg via ORAL
  Administered 2024-02-03: 10 mg via ORAL
  Administered 2024-02-04: 5 mg via ORAL
  Filled 2024-02-01: qty 2
  Filled 2024-02-01: qty 1
  Filled 2024-02-01: qty 2
  Filled 2024-02-01: qty 1
  Filled 2024-02-01 (×4): qty 2

## 2024-02-01 MED ORDER — MUPIROCIN 2 % EX OINT
1.0000 | TOPICAL_OINTMENT | Freq: Two times a day (BID) | CUTANEOUS | Status: DC
  Administered 2024-02-01 – 2024-02-03 (×4): 1 via NASAL
  Filled 2024-02-01 (×2): qty 22

## 2024-02-01 MED ORDER — PIPERACILLIN-TAZOBACTAM 3.375 G IVPB 30 MIN
3.3750 g | Freq: Once | INTRAVENOUS | Status: AC
  Administered 2024-02-01: 3.375 g via INTRAVENOUS
  Filled 2024-02-01: qty 50

## 2024-02-01 MED ORDER — LACTATED RINGERS IV SOLN: INTRAVENOUS | Status: AC

## 2024-02-01 MED ORDER — HYDROMORPHONE HCL 1 MG/ML IJ SOLN
0.5000 mg | INTRAMUSCULAR | Status: DC | PRN
  Administered 2024-02-02: 0.5 mg via INTRAVENOUS
  Filled 2024-02-01: qty 0.5

## 2024-02-01 MED ORDER — IOHEXOL 350 MG/ML SOLN
75.0000 mL | Freq: Once | INTRAVENOUS | Status: AC | PRN
  Administered 2024-02-01: 75 mL via INTRAVENOUS

## 2024-02-01 MED ORDER — ENOXAPARIN SODIUM 40 MG/0.4ML IJ SOSY: 40.0000 mg | PREFILLED_SYRINGE | INTRAMUSCULAR | Status: DC

## 2024-02-01 MED ORDER — ONDANSETRON HCL 4 MG/2ML IJ SOLN
4.0000 mg | Freq: Once | INTRAMUSCULAR | Status: AC
  Administered 2024-02-01: 4 mg via INTRAVENOUS
  Filled 2024-02-01: qty 2

## 2024-02-01 MED ORDER — LACTATED RINGERS IV BOLUS
1000.0000 mL | Freq: Once | INTRAVENOUS | Status: AC
  Administered 2024-02-01: 1000 mL via INTRAVENOUS

## 2024-02-01 MED ORDER — PIPERACILLIN-TAZOBACTAM 3.375 G IVPB
3.3750 g | Freq: Three times a day (TID) | INTRAVENOUS | Status: DC
  Administered 2024-02-02 – 2024-02-04 (×7): 3.375 g via INTRAVENOUS
  Filled 2024-02-01 (×7): qty 50

## 2024-02-01 NOTE — ED Triage Notes (Signed)
 Pt states she has right lower abdominal pain X 2 days. She hasn't taken any meds.

## 2024-02-01 NOTE — ED Provider Notes (Signed)
 [facility]  Note:  This document was prepared using Dragon voice recognition software and may include unintentional dictation errors.  MRN: [id number] DOB: 12-09-1965  Subjective:   Christina Montoya is a 58 y.o. female presenting for right lower quadrant abdominal pain x 2 days with mild nausea.  Patient reports that there were no precipitating factors leading up to the onset of abdominal pain.  Patient reports that she has had normal bowel movements, no dysuria, increased urinary frequency, flank pain or abdominal pain in any other region.  Patient was told by her coworker that she should come in for evaluation as it might be an appendicitis.  Is also of note the patient has a elevated temperature of 99.6 in triage.  No current facility-administered medications for this encounter.  Current Outpatient Medications:    sertraline (ZOLOFT) 100 MG tablet, 150 mg. , Disp: , Rfl: 0   ALPRAZolam (XANAX) 1 MG tablet, , Disp: , Rfl: 0   cetirizine (ZYRTEC) 10 MG tablet, Take 1 tablet (10 mg total) by mouth daily., Disp: 20 tablet, Rfl: 1   chlorzoxazone (PARAFON) 500 MG tablet, Take 1 tablet (500 mg total) by mouth 3 (three) times daily as needed for muscle spasms., Disp: 15 tablet, Rfl: 0   ondansetron (ZOFRAN ODT) 4 MG disintegrating tablet, Take 1 tablet (4 mg total) by mouth every 8 (eight) hours as needed for nausea or vomiting., Disp: 15 tablet, Rfl: 0   traMADol (ULTRAM-ER) 100 MG 24 hr tablet, Take 1 tablet (100 mg total) by mouth daily as needed for pain., Disp: 60 tablet, Rfl: 0   Allergies  Allergen Reactions   Nsaids Other (See Comments)    heartburn   Meperidine Hcl     Other Reaction(s): involuntary muscle spasms, red urine   Septra [Sulfamethoxazole-Trimethoprim] Hives   Tetracycline Hcl Hives and Rash    Past Medical History:  Diagnosis Date   Arthritis    Migraines      History reviewed. No pertinent surgical history.  Family History  Problem Relation  Age of Onset   Healthy Mother    Anuerysm Father     Social History   Tobacco Use   Smoking status: Former    Current packs/day: 0.00    Types: Cigarettes    Quit date: 11/14/1995    Years since quitting: 28.2   Smokeless tobacco: Never  Vaping Use   Vaping status: Never Used  Substance Use Topics   Alcohol use: Yes    Comment: 1 glass a day   Drug use: Never    ROS Refer to HPI for ROS details.  Objective:   Vitals: BP 125/79 (BP Location: Right Arm)   Pulse 96   Temp 99.6 F (37.6 C) (Oral)   Resp 18   SpO2 95%   Physical Exam  Procedures  Results for orders placed or performed during the hospital encounter of 02/01/24 (from the past 24 hours)  POC urinalysis dipstick     Status: Abnormal   Collection Time: 02/01/24  4:03 PM  Result Value Ref Range   Color, UA yellow yellow   Clarity, UA hazy (A) clear   Glucose, UA negative negative mg/dL   Bilirubin, UA small (A) negative   Ketones, POC UA negative negative mg/dL   Spec Grav, UA >=2.841 (A) 1.010 - 1.025   Blood, UA trace-lysed (A) negative   pH, UA 5.5 5.0 - 8.0   Protein Ur, POC trace (A) negative mg/dL   Urobilinogen, UA  0.2 0.2 or 1.0 E.U./dL   Nitrite, UA Negative Negative   Leukocytes, UA Trace (A) Negative    Assessment and Plan :   PDMP not reviewed this encounter.  1. Right lower quadrant abdominal pain    1. Right lower quadrant abdominal pain (Primary) - POC urinalysis dipstick shows trace leukocytes, trace blood, trace protein and small bilirubin, no nitrite, no obvious sign of infection. -Based on presentation with severely tender right lower quadrant abdominal pain with palpation patient was recommended to follow-up in the ER immediately for further evaluation and management of abdominal pain. -Discussed with patient that due to the severity of pain and the lack of advanced imaging and stat laboratory testing in urgent care emergency room would be able to provide her with appropriate  level of care. -Patient advised that evaluation for possible acute appendicitis is recommended in emergency department. -Patient reports understanding and states that she will go across the street to the emergency room for further evaluation and management of her right lower quadrant abdominal pain.  Lucky Cowboy   [place], [place], [place] 02/01/24 1610

## 2024-02-01 NOTE — ED Notes (Signed)
 Patient is being discharged from the Urgent Care and sent to the Emergency Department via POV . Per Vickki Hearing, NP, patient is in need of higher level of care due to RLQ Pain and tenderness. Patient is aware and verbalizes understanding of plan of care.  Vitals:   02/01/24 1548  BP: 125/79  Pulse: 96  Resp: 18  Temp: 99.6 F (37.6 C)  SpO2: 95%

## 2024-02-01 NOTE — H&P (Signed)
 HPI  Christina Montoya is an 58 y.o. female with history of partial hysterectomy for fibroids who presents with abdominal pain.  Patient states abdominal pain present x 2 days. Reports nausea without emesis. Normal bowel function. Last BM earlier today. No fevers/chills.   No leukocytosis on labs. CT scan shows acutely inflamed appendix with appendicolith. No evidence of perforation or abscess.  10 point review of systems is negative except as listed above in HPI.  Objective  Past Medical History: Past Medical History:  Diagnosis Date   Arthritis    Migraines     Past Surgical History: History reviewed. No pertinent surgical history.  Family History:  Family History  Problem Relation Age of Onset   Healthy Mother    Anuerysm Father     Social History:  reports that she quit smoking about 28 years ago. Her smoking use included cigarettes. She has never used smokeless tobacco. She reports current alcohol use of about 7.0 standard drinks of alcohol per week. She reports that she does not use drugs.  Allergies:  Allergies  Allergen Reactions   Nsaids Other (See Comments)    heartburn   Meperidine Hcl     Other Reaction(s): involuntary muscle spasms, red urine   Septra [Sulfamethoxazole-Trimethoprim] Hives   Tetracycline Hcl Hives and Rash    Medications: I have reviewed the patient's current medications.  Labs: I have personally reviewed all labs for the past 24h  Imaging: I have personally reviewed and interpreted all imaging for the past 24h and agree with the radiologist's impression.  CT ABDOMEN PELVIS W CONTRAST Result Date: 02/01/2024 CLINICAL DATA:  Right lower quadrant abdominal pain. EXAM: CT ABDOMEN AND PELVIS WITH CONTRAST TECHNIQUE: Multidetector CT imaging of the abdomen and pelvis was performed using the standard protocol following bolus administration of intravenous contrast. RADIATION DOSE REDUCTION: This exam was performed according to the departmental  dose-optimization program which includes automated exposure control, adjustment of the mA and/or kV according to patient size and/or use of iterative reconstruction technique. CONTRAST:  75mL OMNIPAQUE IOHEXOL 350 MG/ML SOLN COMPARISON:  None Available. FINDINGS: Lower chest: No acute abnormality. Hepatobiliary: No focal liver abnormality is seen. No gallstones, gallbladder wall thickening, or biliary dilatation. Pancreas: Unremarkable. No pancreatic ductal dilatation or surrounding inflammatory changes. Spleen: Normal in size without focal abnormality. Adrenals/Urinary Tract: Adrenal glands are unremarkable. Kidneys are normal, without renal calculi, focal lesion, or hydronephrosis. Bladder is unremarkable. Stomach/Bowel: Stomach is within normal limits. An 8 mm appendicolith is seen within the distal lumen of a markedly inflamed appendix. There is no evidence of associated perforation or abscess. No evidence of bowel wall thickening, distention, or inflammatory changes. Vascular/Lymphatic: No significant vascular findings are present. No enlarged abdominal or pelvic lymph nodes. Reproductive: Status post hysterectomy. No adnexal masses. Other: There is a small right sided fat containing paraumbilical hernia. No abdominopelvic ascites. Musculoskeletal: No acute or significant osseous findings. IMPRESSION: Acute appendicitis. Electronically Signed   By: Aram Candela M.D.   On: 02/01/2024 20:18     Physical Exam Blood pressure 117/68, pulse 86, temperature 99.8 F (37.7 C), temperature source Oral, resp. rate 16, height 5\' 5"  (1.651 m), weight 81.6 kg, SpO2 99%. Constitutional: well-developed, well-nourished HEENT: pupils equal, round, reactive to light, moist conjunctiva, hearing intact Oropharynx: mucous membranes dry CV: Regular rate and rhythm, normotensive Chest: equal chest rise bilaterally, normal respiratory effort Abdomen: soft, nondistended, tender to palpation in RLQ. Well healed remote  laparoscopic incisions Extremities: moves all extremities, no  peripheral edema Skin: warm, dry, no rashes Psych: normal memory, normal mood/affect  Neuro: No focal neurologic deficits, A&Ox3    Assessment   Christina Montoya is an 58 y.o. female with acute appendicitis  Plan  - OR in AM for laparoscopic appendectomy. Discussed with patient that unfortunately due to other emergent case, will not be able to perform this evening.  -The pathophysiology of appendicitis was discussed with the patient.  Natural history risks without surgery was discussed.   I feel the risks of no intervention outweigh the operative risks; therefore, I recommended diagnostic laparoscopy with appendectomy.  Laparoscopic & open techniques were discussed. Risks including but not limited to: bleeding, infection, abscess, leak, reoperation, injury to other organs, need for repair of tissues / organs, possible ileocecectomy, possible conversion to open operation, possible need for drain, hernia, and post-op ileus were discussed. Goals of post-operative recovery were discussed as well.  Questions were answered.  The patient expresses understanding & wishes to proceed with surgery.  - Okay to have some minimal clears until midnight, then NPO - Multimodal pain control - DVT - SCDs, LMWH - Dispo - med-surg, obs. Hopeful discharge after OR in AM.  I reviewed ED provider notes, last 24 h vitals and pain scores, last 48 h intake and output, last 24 h labs and trends, and last 24 h imaging results.  This care required moderate level of medical decision making.    Donata Duff, MD [facility]

## 2024-02-01 NOTE — ED Notes (Signed)
 C-t reports that there are 4 ahead of this pt and a trauma on the table  pt informed

## 2024-02-01 NOTE — ED Provider Triage Note (Signed)
 Emergency Medicine Provider Triage Evaluation Note  Christina Montoya , a 58 y.o. female  was evaluated in triage.  Pt complains of RLQ pain in past two days, constant, dull, non radiating.recently also had some chest tightness.  +nausea/decreased appetite. No vomiting.   Review of Systems  Positive: Abd pain Negative: fever  Physical Exam  BP 126/85   Pulse 97   Temp 99.2 F (37.3 C) (Oral)   Resp 16   Ht 1.651 m (5\' 5" )   Wt 81.6 kg   SpO2 96%   BMI 29.94 kg/m  Gen:   Awake, no distress   Resp:  Normal effort cta bil. Rrr.  Abd soft rlq tenderness.    Medical Decision Making  Medically screening exam initiated at 5:32 PM.  Appropriate orders placed.  CHETARA KROPP was informed that the remainder of the evaluation will be completed by another provider, this initial triage assessment does not replace that evaluation, and the importance of remaining in the ED until their evaluation is complete.  Labs and imaging ordered. Meds and ivf ordered.    Cathren Laine, MD 02/01/24 [phone]

## 2024-02-01 NOTE — Discharge Instructions (Addendum)
 1. Right lower quadrant abdominal pain (Primary) - POC urinalysis dipstick shows trace leukocytes, trace blood, trace protein and small bilirubin, no nitrite, no obvious sign of infection. -Based on presentation with severely tender right lower quadrant abdominal pain with palpation patient was recommended to follow-up in the ER immediately for further evaluation and management of abdominal pain. -Discussed with patient that due to the severity of pain and the lack of advanced imaging and stat laboratory testing in urgent care emergency room would be able to provide her with appropriate level of care. -Patient reports understanding and states that she will go across the street to the emergency room for further evaluation and management of her right lower quadrant abdominal pain.

## 2024-02-01 NOTE — ED Notes (Signed)
Awaiting patient from triage

## 2024-02-01 NOTE — ED Provider Notes (Signed)
 [facility] AT [facility] Provider Note   CSN: [id number] Arrival date & time: 02/01/24  1614     History  Chief Complaint  Patient presents with   Abdominal Pain    Christina Montoya is a 58 y.o. female.  Pt with c/o acute onset RLQ abd pain two days ago - constant dull pain since, mod/severe, non radiating. No hx same pain. Nausea/decreased appetite. No emesis. Having bms. No prior abd surgery. No fever or chills. No back/flank pain.   The history is provided by the patient and medical records.  Abdominal Pain Associated symptoms: nausea   Associated symptoms: no chest pain, no cough, no dysuria, no fever and no shortness of breath        Home Medications Prior to Admission medications   Medication Sig Start Date End Date Taking? Authorizing Provider  ALPRAZolam Prudy Feeler) 1 MG tablet  11/23/14   [provider]  cetirizine (ZYRTEC) 10 MG tablet Take 1 tablet (10 mg total) by mouth daily. 01/26/15   Ralene Cork, DO  chlorzoxazone (PARAFON) 500 MG tablet Take 1 tablet (500 mg total) by mouth 3 (three) times daily as needed for muscle spasms. 07/26/19   Hall-Potvin, [place], PA-C  ondansetron (ZOFRAN ODT) 4 MG disintegrating tablet Take 1 tablet (4 mg total) by mouth every 8 (eight) hours as needed for nausea or vomiting. 07/26/19   Hall-Potvin, [place], PA-C  sertraline (ZOLOFT) 100 MG tablet 150 mg.  12/06/14   [provider]  traMADol (ULTRAM-ER) 100 MG 24 hr tablet Take 1 tablet (100 mg total) by mouth daily as needed for pain. 06/29/20   Ralene Cork, DO      Allergies    Nsaids, Meperidine hcl, Septra [sulfamethoxazole-trimethoprim], and Tetracycline hcl    Review of Systems   Review of Systems  Constitutional:  Negative for fever.  Respiratory:  Negative for cough and shortness of breath.   Cardiovascular:  Negative for chest pain.  Gastrointestinal:  Positive for abdominal pain and nausea.  Genitourinary:   Negative for dysuria.  Musculoskeletal:  Negative for back pain.  Neurological:  Negative for headaches.    Physical Exam Updated Vital Signs BP 126/85   Pulse 97   Temp 99.2 F (37.3 C) (Oral)   Resp 16   Ht 1.651 m (5\' 5" )   Wt 81.6 kg   SpO2 96%   BMI 29.94 kg/m  Physical Exam Vitals and nursing note reviewed.  Constitutional:      Appearance: Normal appearance. She is well-developed.  HENT:     Head: Atraumatic.     Nose: Nose normal.     Mouth/Throat:     Mouth: Mucous membranes are moist.  Eyes:     General: No scleral icterus.    Conjunctiva/sclera: Conjunctivae normal.  Neck:     Trachea: No tracheal deviation.  Cardiovascular:     Rate and Rhythm: Normal rate and regular rhythm.     Pulses: Normal pulses.     Heart sounds: Normal heart sounds. No murmur heard.    No friction rub. No gallop.  Pulmonary:     Effort: Pulmonary effort is normal. No respiratory distress.     Breath sounds: Normal breath sounds.  Abdominal:     General: Bowel sounds are normal. There is no distension.     Palpations: Abdomen is soft.     Tenderness: There is abdominal tenderness. There is no guarding.     Comments: Moderate RLQ tenderness.  Genitourinary:    Comments: No cva tenderness.  Musculoskeletal:        General: No swelling.     Cervical back: Neck supple. No muscular tenderness.  Skin:    General: Skin is warm and dry.     Findings: No rash.  Neurological:     Mental Status: She is alert.     Comments: Alert, speech normal.   Psychiatric:        Mood and Affect: Mood normal.     ED Results / Procedures / Treatments   Labs (all labs ordered are listed, but only abnormal results are displayed) Results for orders placed or performed during the hospital encounter of 02/01/24  Lipase, blood   Collection Time: 02/01/24  4:49 PM  Result Value Ref Range   Lipase 32 11 - 51 U/L  Comprehensive metabolic panel   Collection Time: 02/01/24  4:49 PM  Result Value  Ref Range   Sodium 136 135 - 145 mmol/L   Potassium 4.1 3.5 - 5.1 mmol/L   Chloride 100 98 - 111 mmol/L   CO2 23 22 - 32 mmol/L   Glucose, Bld 100 (H) 70 - 99 mg/dL   BUN 15 6 - 20 mg/dL   Creatinine, Ser 1.47 0.44 - 1.00 mg/dL   Calcium 9.9 8.9 - 82.9 mg/dL   Total Protein 7.3 6.5 - 8.1 g/dL   Albumin 4.1 3.5 - 5.0 g/dL   AST 21 15 - 41 U/L   ALT 27 0 - 44 U/L   Alkaline Phosphatase 66 38 - 126 U/L   Total Bilirubin 1.3 (H) 0.0 - 1.2 mg/dL   GFR, Estimated >56 >21 mL/min   Anion gap 13 5 - 15  CBC   Collection Time: 02/01/24  4:49 PM  Result Value Ref Range   WBC 10.3 4.0 - 10.5 K/uL   RBC 4.61 3.87 - 5.11 MIL/uL   Hemoglobin 14.2 12.0 - 15.0 g/dL   HCT 30.8 65.7 - 84.6 %   MCV 96.3 80.0 - 100.0 fL   MCH 30.8 26.0 - 34.0 pg   MCHC 32.0 30.0 - 36.0 g/dL   RDW 96.2 95.2 - 84.1 %   Platelets 242 150 - 400 K/uL   nRBC 0.0 0.0 - 0.2 %  Troponin I (High Sensitivity)   Collection Time: 02/01/24  4:49 PM  Result Value Ref Range   Troponin I (High Sensitivity) 6 <18 ng/L   CT ABDOMEN PELVIS W CONTRAST Result Date: 02/01/2024 CLINICAL DATA:  Right lower quadrant abdominal pain. EXAM: CT ABDOMEN AND PELVIS WITH CONTRAST TECHNIQUE: Multidetector CT imaging of the abdomen and pelvis was performed using the standard protocol following bolus administration of intravenous contrast. RADIATION DOSE REDUCTION: This exam was performed according to the departmental dose-optimization program which includes automated exposure control, adjustment of the mA and/or kV according to patient size and/or use of iterative reconstruction technique. CONTRAST:  75mL OMNIPAQUE IOHEXOL 350 MG/ML SOLN COMPARISON:  None Available. FINDINGS: Lower chest: No acute abnormality. Hepatobiliary: No focal liver abnormality is seen. No gallstones, gallbladder wall thickening, or biliary dilatation. Pancreas: Unremarkable. No pancreatic ductal dilatation or surrounding inflammatory changes. Spleen: Normal in size without  focal abnormality. Adrenals/Urinary Tract: Adrenal glands are unremarkable. Kidneys are normal, without renal calculi, focal lesion, or hydronephrosis. Bladder is unremarkable. Stomach/Bowel: Stomach is within normal limits. An 8 mm appendicolith is seen within the distal lumen of a markedly inflamed appendix. There is no evidence of associated perforation or abscess. No evidence of  bowel wall thickening, distention, or inflammatory changes. Vascular/Lymphatic: No significant vascular findings are present. No enlarged abdominal or pelvic lymph nodes. Reproductive: Status post hysterectomy. No adnexal masses. Other: There is a small right sided fat containing paraumbilical hernia. No abdominopelvic ascites. Musculoskeletal: No acute or significant osseous findings. IMPRESSION: Acute appendicitis. Electronically Signed   By: Aram Candela M.D.   On: 02/01/2024 20:18     EKG EKG Interpretation Date/Time:  Friday February 01 2024 18:10:19 EDT Ventricular Rate:  93 PR Interval:  168 QRS Duration:  78 QT Interval:  330 QTC Calculation: 410 R Axis:   15  Text Interpretation: Normal sinus rhythm Normal ECG No previous ECGs available Confirmed by Cathren Laine ([id number]) on 02/01/2024 6:24:35 PM  Radiology CT ABDOMEN PELVIS W CONTRAST Result Date: 02/01/2024 CLINICAL DATA:  Right lower quadrant abdominal pain. EXAM: CT ABDOMEN AND PELVIS WITH CONTRAST TECHNIQUE: Multidetector CT imaging of the abdomen and pelvis was performed using the standard protocol following bolus administration of intravenous contrast. RADIATION DOSE REDUCTION: This exam was performed according to the departmental dose-optimization program which includes automated exposure control, adjustment of the mA and/or kV according to patient size and/or use of iterative reconstruction technique. CONTRAST:  75mL OMNIPAQUE IOHEXOL 350 MG/ML SOLN COMPARISON:  None Available. FINDINGS: Lower chest: No acute abnormality. Hepatobiliary: No focal liver  abnormality is seen. No gallstones, gallbladder wall thickening, or biliary dilatation. Pancreas: Unremarkable. No pancreatic ductal dilatation or surrounding inflammatory changes. Spleen: Normal in size without focal abnormality. Adrenals/Urinary Tract: Adrenal glands are unremarkable. Kidneys are normal, without renal calculi, focal lesion, or hydronephrosis. Bladder is unremarkable. Stomach/Bowel: Stomach is within normal limits. An 8 mm appendicolith is seen within the distal lumen of a markedly inflamed appendix. There is no evidence of associated perforation or abscess. No evidence of bowel wall thickening, distention, or inflammatory changes. Vascular/Lymphatic: No significant vascular findings are present. No enlarged abdominal or pelvic lymph nodes. Reproductive: Status post hysterectomy. No adnexal masses. Other: There is a small right sided fat containing paraumbilical hernia. No abdominopelvic ascites. Musculoskeletal: No acute or significant osseous findings. IMPRESSION: Acute appendicitis. Electronically Signed   By: Aram Candela M.D.   On: 02/01/2024 20:18    Procedures Procedures    Medications Ordered in ED Medications  piperacillin-tazobactam (ZOSYN) IVPB 3.375 g (3.375 g Intravenous New Bag/Given 02/01/24 2025)  lactated ringers bolus 1,000 mL (1,000 mLs Intravenous New Bag/Given 02/01/24 1912)  ondansetron (ZOFRAN) injection 4 mg (4 mg Intravenous Given 02/01/24 1812)  morphine (PF) 2 MG/ML injection 4 mg (4 mg Intravenous Given 02/01/24 1818)  iohexol (OMNIPAQUE) 350 MG/ML injection 75 mL (75 mLs Intravenous Contrast Given 02/01/24 1947)  morphine (PF) 4 MG/ML injection 4 mg (4 mg Intravenous Given 02/01/24 1953)    ED Course/ Medical Decision Making/ A&P                                 Medical Decision Making Problems Addressed: Acute appendicitis with localized peritonitis, without perforation, abscess, or gangrene: acute illness or injury with systemic symptoms that  poses a threat to life or bodily functions  Amount and/or Complexity of Data Reviewed Labs: ordered. Decision-making details documented in ED Course. Radiology: ordered and independent interpretation performed. Decision-making details documented in ED Course. ECG/medicine tests: ordered and independent interpretation performed. Decision-making details documented in ED Course. Discussion of management or test interpretation with external provider(s): Gen surgery  Risk Prescription drug management. Parenteral controlled  substances. Decision regarding hospitalization.   Iv ns.  Labs ordered/sent. Imaging ordered.   Differential diagnosis includes appendicitis, biliary colic, colitis, etc . Dispo decision including potential need for admission considered - will get labs and imaging and reassess.   Reviewed nursing notes and prior charts for additional history. External reports reviewed.  LR bolus. Morphine iv. Zofran iv.   Labs reviewed/interpreted by me - wbc 10. Chem unremarkable.   CT reviewed/interpreted by me - c/w appendicitis.   Gen surgery consulted - discussed pt - will see in ED.  Zosyn iv.            Final Clinical Impression(s) / ED Diagnoses Final diagnoses:  Acute appendicitis with localized peritonitis, without perforation, abscess, or gangrene    Rx / DC Orders ED Discharge Orders     None         Cathren Laine, MD 02/01/24 2039

## 2024-02-01 NOTE — ED Triage Notes (Signed)
 Pt c/o RLQ abdominal pain, heart burn and nauseax2d. Pt denies vomiting

## 2024-02-01 NOTE — ED Notes (Signed)
 Patient signed consent form for her appendectomy .

## 2024-02-01 NOTE — ED Notes (Addendum)
 Surgeon ( Dr. Azucena Cecil) explained plan of care / admission and surgery to patient .

## 2024-02-02 ENCOUNTER — Encounter (HOSPITAL_COMMUNITY): Admission: EM | Disposition: A | Discharge status: Home / Self Care | Payer: Self-pay | Source: Home / Self Care

## 2024-02-02 ENCOUNTER — Observation Stay (HOSPITAL_COMMUNITY): Admitting: Anesthesiology

## 2024-02-02 ENCOUNTER — Encounter (HOSPITAL_COMMUNITY): Payer: Self-pay

## 2024-02-02 DIAGNOSIS — K353 Acute appendicitis with localized peritonitis, without perforation or gangrene: Secondary | ICD-10-CM | POA: Diagnosis not present

## 2024-02-02 HISTORY — PX: LAPAROSCOPIC APPENDECTOMY: SHX408

## 2024-02-02 LAB — PHOSPHORUS: Phosphorus: 3.7 mg/dL (ref 2.5–4.6)

## 2024-02-02 LAB — CBC
HCT: 40.5 % (ref 36.0–46.0)
Hemoglobin: 13.4 g/dL (ref 12.0–15.0)
MCH: 31.5 pg (ref 26.0–34.0)
MCHC: 33.1 g/dL (ref 30.0–36.0)
MCV: 95.1 fL (ref 80.0–100.0)
Platelets: 233 10*3/uL (ref 150–400)
RBC: 4.26 MIL/uL (ref 3.87–5.11)
RDW: 12.7 % (ref 11.5–15.5)
WBC: 12.1 10*3/uL — ABNORMAL HIGH (ref 4.0–10.5)
nRBC: 0 % (ref 0.0–0.2)

## 2024-02-02 LAB — BASIC METABOLIC PANEL
Anion gap: 4 — ABNORMAL LOW (ref 5–15)
BUN: 11 mg/dL (ref 6–20)
CO2: 26 mmol/L (ref 22–32)
Calcium: 8.5 mg/dL — ABNORMAL LOW (ref 8.9–10.3)
Chloride: 102 mmol/L (ref 98–111)
Creatinine, Ser: 0.87 mg/dL (ref 0.44–1.00)
GFR, Estimated: >60 mL/min (ref >60)
Glucose, Bld: 102 mg/dL — ABNORMAL HIGH (ref 70–99)
Potassium: 4.2 mmol/L (ref 3.5–5.1)
Sodium: 132 mmol/L — ABNORMAL LOW (ref 135–145)

## 2024-02-02 LAB — SURGICAL PCR SCREEN
MRSA, PCR: NEGATIVE
Staphylococcus aureus: NEGATIVE

## 2024-02-02 LAB — MAGNESIUM: Magnesium: 1.7 mg/dL (ref 1.7–2.4)

## 2024-02-02 SURGERY — APPENDECTOMY, LAPAROSCOPIC
Anesthesia: General

## 2024-02-02 MED ORDER — ACETAMINOPHEN 10 MG/ML IV SOLN
INTRAVENOUS | Status: AC
  Filled 2024-02-02: qty 100

## 2024-02-02 MED ORDER — PROPOFOL 10 MG/ML IV BOLUS
INTRAVENOUS | Status: AC
  Filled 2024-02-02: qty 20

## 2024-02-02 MED ORDER — ORAL CARE MOUTH RINSE: 15.0000 mL | Freq: Once | OROMUCOSAL | Status: AC

## 2024-02-02 MED ORDER — ACETAMINOPHEN 160 MG/5ML PO SOLN: 1000.0000 mg | Freq: Once | ORAL | Status: DC | PRN

## 2024-02-02 MED ORDER — ONDANSETRON HCL 4 MG/2ML IJ SOLN
INTRAMUSCULAR | Status: AC
  Filled 2024-02-02: qty 2

## 2024-02-02 MED ORDER — CHLORHEXIDINE GLUCONATE 0.12 % MT SOLN
OROMUCOSAL | Status: AC
Start: 2024-02-02 — End: 2024-02-02
  Administered 2024-02-02: 15 mL via OROMUCOSAL
  Filled 2024-02-02: qty 15

## 2024-02-02 MED ORDER — 0.9 % SODIUM CHLORIDE (POUR BTL) OPTIME
TOPICAL | Status: DC | PRN
  Administered 2024-02-02: 1000 mL

## 2024-02-02 MED ORDER — MIDAZOLAM HCL 2 MG/2ML IJ SOLN
INTRAMUSCULAR | Status: DC | PRN
  Administered 2024-02-02: 2 mg via INTRAVENOUS

## 2024-02-02 MED ORDER — METOCLOPRAMIDE HCL 5 MG/ML IJ SOLN
INTRAMUSCULAR | Status: AC
  Filled 2024-02-02: qty 2

## 2024-02-02 MED ORDER — LIDOCAINE 2% (20 MG/ML) 5 ML SYRINGE
INTRAMUSCULAR | Status: DC | PRN
  Administered 2024-02-02: 60 mg via INTRAVENOUS

## 2024-02-02 MED ORDER — SUGAMMADEX SODIUM 200 MG/2ML IV SOLN
INTRAVENOUS | Status: DC | PRN
  Administered 2024-02-02: 300 mg via INTRAVENOUS

## 2024-02-02 MED ORDER — OXYCODONE HCL 5 MG/5ML PO SOLN: 5.0000 mg | Freq: Once | ORAL | Status: AC | PRN

## 2024-02-02 MED ORDER — BUPIVACAINE-EPINEPHRINE (PF) 0.25% -1:200000 IJ SOLN
INTRAMUSCULAR | Status: AC
  Filled 2024-02-02: qty 30

## 2024-02-02 MED ORDER — PHENYLEPHRINE 80 MCG/ML (10ML) SYRINGE FOR IV PUSH (FOR BLOOD PRESSURE SUPPORT)
PREFILLED_SYRINGE | INTRAVENOUS | Status: AC
  Filled 2024-02-02: qty 10

## 2024-02-02 MED ORDER — ONDANSETRON HCL 4 MG/2ML IJ SOLN
INTRAMUSCULAR | Status: DC | PRN
  Administered 2024-02-02: 4 mg via INTRAVENOUS

## 2024-02-02 MED ORDER — DEXAMETHASONE SODIUM PHOSPHATE 10 MG/ML IJ SOLN
INTRAMUSCULAR | Status: DC | PRN
  Administered 2024-02-02: 10 mg via INTRAVENOUS

## 2024-02-02 MED ORDER — ROCURONIUM BROMIDE 10 MG/ML (PF) SYRINGE
PREFILLED_SYRINGE | INTRAVENOUS | Status: AC
  Filled 2024-02-02: qty 10

## 2024-02-02 MED ORDER — SODIUM CHLORIDE 0.9 % IV SOLN: 12.5000 mg | Freq: Four times a day (QID) | INTRAVENOUS | Status: DC | PRN

## 2024-02-02 MED ORDER — CHLORHEXIDINE GLUCONATE 0.12 % MT SOLN: 15.0000 mL | Freq: Once | OROMUCOSAL | Status: AC

## 2024-02-02 MED ORDER — DEXAMETHASONE SODIUM PHOSPHATE 10 MG/ML IJ SOLN
INTRAMUSCULAR | Status: AC
  Filled 2024-02-02: qty 1

## 2024-02-02 MED ORDER — LACTATED RINGERS IV SOLN: INTRAVENOUS | Status: DC

## 2024-02-02 MED ORDER — OXYCODONE HCL 5 MG PO TABS
ORAL_TABLET | ORAL | Status: AC
  Administered 2024-02-02: 5 mg via ORAL
  Filled 2024-02-02: qty 1

## 2024-02-02 MED ORDER — SODIUM CHLORIDE 0.9 % IR SOLN
Status: DC | PRN
  Administered 2024-02-02: 1000 mL

## 2024-02-02 MED ORDER — METOCLOPRAMIDE HCL 5 MG/ML IJ SOLN
INTRAMUSCULAR | Status: DC | PRN
  Administered 2024-02-02: 10 mg via INTRAVENOUS

## 2024-02-02 MED ORDER — FENTANYL CITRATE (PF) 250 MCG/5ML IJ SOLN
INTRAMUSCULAR | Status: AC
  Filled 2024-02-02: qty 5

## 2024-02-02 MED ORDER — FENTANYL CITRATE (PF) 250 MCG/5ML IJ SOLN
INTRAMUSCULAR | Status: DC | PRN
  Administered 2024-02-02 (×3): 50 ug via INTRAVENOUS

## 2024-02-02 MED ORDER — LIDOCAINE 2% (20 MG/ML) 5 ML SYRINGE
INTRAMUSCULAR | Status: AC
  Filled 2024-02-02: qty 5

## 2024-02-02 MED ORDER — OXYCODONE HCL 5 MG PO TABS: 5.0000 mg | ORAL_TABLET | Freq: Once | ORAL | Status: AC | PRN

## 2024-02-02 MED ORDER — MIDAZOLAM HCL 2 MG/2ML IJ SOLN
INTRAMUSCULAR | Status: AC
  Filled 2024-02-02: qty 2

## 2024-02-02 MED ORDER — ENOXAPARIN SODIUM 40 MG/0.4ML IJ SOSY
40.0000 mg | PREFILLED_SYRINGE | INTRAMUSCULAR | Status: DC
  Administered 2024-02-02 – 2024-02-03 (×2): 40 mg via SUBCUTANEOUS
  Filled 2024-02-02 (×2): qty 0.4

## 2024-02-02 MED ORDER — ROCURONIUM BROMIDE 10 MG/ML (PF) SYRINGE
PREFILLED_SYRINGE | INTRAVENOUS | Status: DC | PRN
  Administered 2024-02-02: 60 mg via INTRAVENOUS

## 2024-02-02 MED ORDER — AMISULPRIDE (ANTIEMETIC) 5 MG/2ML IV SOLN
INTRAVENOUS | Status: DC | PRN
  Administered 2024-02-02: 5 mg via INTRAVENOUS

## 2024-02-02 MED ORDER — ACETAMINOPHEN 10 MG/ML IV SOLN: 1000.0000 mg | Freq: Once | INTRAVENOUS | Status: DC | PRN

## 2024-02-02 MED ORDER — SUGAMMADEX SODIUM 200 MG/2ML IV SOLN
INTRAVENOUS | Status: AC
  Filled 2024-02-02: qty 2

## 2024-02-02 MED ORDER — ACETAMINOPHEN 500 MG PO TABS: 1000.0000 mg | ORAL_TABLET | Freq: Once | ORAL | Status: DC | PRN

## 2024-02-02 MED ORDER — FENTANYL CITRATE (PF) 100 MCG/2ML IJ SOLN
25.0000 ug | INTRAMUSCULAR | Status: DC | PRN
  Administered 2024-02-02: 25 ug via INTRAVENOUS

## 2024-02-02 MED ORDER — BUPIVACAINE-EPINEPHRINE 0.25% -1:200000 IJ SOLN
INTRAMUSCULAR | Status: DC | PRN
  Administered 2024-02-02: 18 mL

## 2024-02-02 MED ORDER — PROPOFOL 10 MG/ML IV BOLUS
INTRAVENOUS | Status: DC | PRN
  Administered 2024-02-02: 160 mg via INTRAVENOUS

## 2024-02-02 MED ORDER — FENTANYL CITRATE (PF) 100 MCG/2ML IJ SOLN
INTRAMUSCULAR | Status: AC
  Filled 2024-02-02: qty 2

## 2024-02-02 MED ORDER — AMISULPRIDE (ANTIEMETIC) 5 MG/2ML IV SOLN
INTRAVENOUS | Status: AC
  Filled 2024-02-02: qty 2

## 2024-02-02 SURGICAL SUPPLY — 32 items
BAG COUNTER SPONGE SURGICOUNT (BAG) ×2 IMPLANT
CHLORAPREP W/TINT 26 (MISCELLANEOUS) ×2 IMPLANT
COVER SURGICAL LIGHT HANDLE (MISCELLANEOUS) ×2 IMPLANT
CUTTER FLEX LINEAR 45M (STAPLE) ×2 IMPLANT
DERMABOND ADVANCED .7 DNX12 (GAUZE/BANDAGES/DRESSINGS) ×2 IMPLANT
ELECT REM PT RETURN 9FT ADLT (ELECTROSURGICAL) ×1 IMPLANT
ELECTRODE REM PT RTRN 9FT ADLT (ELECTROSURGICAL) ×2 IMPLANT
GLOVE BIO SURGEON STRL SZ8 (GLOVE) ×2 IMPLANT
GLOVE BIOGEL PI IND STRL 8 (GLOVE) ×2 IMPLANT
GOWN STRL REUS W/ TWL XL LVL3 (GOWN DISPOSABLE) ×2 IMPLANT
IRRIG SUCT STRYKERFLOW 2 WTIP (MISCELLANEOUS) ×1 IMPLANT
IRRIGATION SUCT STRKRFLW 2 WTP (MISCELLANEOUS) ×2 IMPLANT
KIT BASIN OR (CUSTOM PROCEDURE TRAY) ×2 IMPLANT
KIT TURNOVER KIT B (KITS) ×2 IMPLANT
NDL 22X1.5 STRL (OR ONLY) (MISCELLANEOUS) ×2 IMPLANT
NEEDLE 22X1.5 STRL (OR ONLY) (MISCELLANEOUS) ×1 IMPLANT
NS IRRIG 1000ML POUR BTL (IV SOLUTION) ×2 IMPLANT
PAD ARMBOARD POSITIONER FOAM (MISCELLANEOUS) ×2 IMPLANT
POUCH RETRIEVAL ECOSAC 10 (ENDOMECHANICALS) ×2 IMPLANT
RELOAD 45 VASCULAR/THIN (ENDOMECHANICALS) ×2 IMPLANT
RELOAD STAPLE 45 2.5 WHT GRN (ENDOMECHANICALS) IMPLANT
SET TUBE SMOKE EVAC HIGH FLOW (TUBING) ×2 IMPLANT
SHEARS HARMONIC ACE PLUS 36CM (ENDOMECHANICALS) ×2 IMPLANT
SPECIMEN JAR SMALL (MISCELLANEOUS) ×2 IMPLANT
SUT VIC AB 4-0 PS2 27 (SUTURE) ×2 IMPLANT
TOWEL GREEN STERILE (TOWEL DISPOSABLE) ×2 IMPLANT
TRAY LAPAROSCOPIC MC (CUSTOM PROCEDURE TRAY) ×2 IMPLANT
TROCAR BALLN 12MMX100 BLUNT (TROCAR) ×2 IMPLANT
TROCAR Z THREAD OPTICAL 12X100 (TROCAR) ×2 IMPLANT
TROCAR Z-THREAD OPTICAL 5X100M (TROCAR) ×2 IMPLANT
WARMER LAPAROSCOPE (MISCELLANEOUS) ×2 IMPLANT
WATER STERILE IRR 1000ML POUR (IV SOLUTION) ×2 IMPLANT

## 2024-02-02 NOTE — Op Note (Signed)
  02/02/2024  8:29 AM  PATIENT:  Christina Montoya  58 y.o. female  PRE-OPERATIVE DIAGNOSIS:  acute appendicitis  POST-OPERATIVE DIAGNOSIS:  acute appendicitis  PROCEDURE:  Procedure(s): APPENDECTOMY, LAPAROSCOPIC  SURGEON:  Surgeon(s): Violeta Gelinas, MD  ASSISTANTS: none   ANESTHESIA:   local and general  EBL:  Total I/O In: -  Out: 5 [Blood:5]  BLOOD ADMINISTERED:none  DRAINS: none   SPECIMEN:  Excision  DISPOSITION OF SPECIMEN:  PATHOLOGY  COUNTS:  YES  DICTATION: .Dragon Dictation Upon entering the abdomen (organ space), I encountered purulence in the RLQ .  CASE DATA:  Type of patient?: DOW CASE (Surgical Hospitalist [facility])  Status of Case? URGENT Add On  Infection Present At Time Of Surgery (PATOS)?  FECULENT PERITONITIS  Findings: Acute appendicitis with perforation 1 cm from the base  Procedure detail: Informed consent was obtained.  She received intravenous antibiotics.  She was brought to the operating room and general endotracheal anesthesia was administered by the anesthesia staff.  Her abdomen was prepped and draped in a sterile fashion.  We did a timeout procedure.The supraumbilical region was infiltrated with local. Supraumbilical incision was made. Subcutaneous tissues were dissected down revealing the anterior fascia. This was divided sharply along the midline. Peritoneal cavity was entered under direct vision without complication. A 0 Vicryl pursestring was placed around the fascial opening. Hassan trocar was inserted into the abdomen. The abdomen was insufflated with carbon dioxide in standard fashion. Under direct vision a 5 mm right abdominal and a 12 mm left lower quadrant port were placed.  Local was used at each port site.  Laparoscopic exploration revealed the appendix a bit adherent to the right lateral pelvic sidewall.  This was easily freed up and identified significant acute appendicitis with a perforation near the base.  It was about  1 cm distal to the base.  There was a small contained associated purulent collection. The mesoappendix was divided with the harmonic scalpel achieving excellent hemostasis.  I was then able to wire a Endo GIA with vascular load stapler right at the base.  It was a bit wide at that portion so I placed 2 firings.  The appendix was placed in an Ecosac bag and removed from the abdomen.  It was sent to pathology.  The area was copiously irrigated with saline.  There was excellent hemostasis.  The staple line looked intact on the base of the cecum.  Four-quadrant inspection revealed no complicating features.  Ports were removed under direct vision.  Pneumoperitoneum was released.  Supraumbilical fascia was closed by tying the pursestring.  All 3 wounds were irrigated and the skin of each was closed with 4-0 Vicryl subcuticular followed by Dermabond.  All counts were correct.  She tolerated the procedure well without apparent complication was taken recovery in stable condition.        PATIENT DISPOSITION:  PACU - hemodynamically stable.   Delay start of Pharmacological VTE agent (>24hrs) due to surgical blood loss or risk of bleeding:  no  Violeta Gelinas, MD, MPH, FACS Pager: [phone]  3/22/20258:29 AM

## 2024-02-02 NOTE — Transfer of Care (Signed)
 Immediate Anesthesia Transfer of Care Note  Patient: Christina Montoya  Procedure(s) Performed: APPENDECTOMY, LAPAROSCOPIC  Patient Location: PACU  Anesthesia Type:General  Level of Consciousness: awake, alert , oriented, and patient cooperative  Airway & Oxygen Therapy: Patient Spontanous Breathing and Patient connected to nasal cannula oxygen  Post-op Assessment: Report given to RN, Post -op Vital signs reviewed and stable, Patient moving all extremities, and Patient moving all extremities X 4  Post vital signs: Reviewed and stable  Last Vitals:  Vitals Value Taken Time  BP 103/61 02/02/24 0845  Temp 37.3 C 02/02/24 0841  Pulse 86 02/02/24 0853  Resp 14 02/02/24 0853  SpO2 94 % 02/02/24 0853  Vitals shown include unfiled device data.  Last Pain:  Vitals:   02/02/24 0845  TempSrc:   PainSc: 0-No pain         Complications: There were no known notable events for this encounter.

## 2024-02-02 NOTE — Plan of Care (Signed)

## 2024-02-02 NOTE — Progress Notes (Signed)
 Patient ID: Christina Montoya, female   DOB: 27-Jul-1966, 58 y.o.   MRN: [id number] * Day of Surgery *    Subjective: RLQ pain Reports HX nausea after general anesthesia ROS negative except as listed above. Objective: Vital signs in last 24 hours: Temp:  [99 F (37.2 C)-100.2 F (37.9 C)] 99.5 F (37.5 C) (03/22 4098) Pulse Rate:  [86-100] 100 (03/22 0426) Resp:  [16-18] 18 (03/22 1191) BP: (102-147)/(58-85) 111/64 (03/22 4782) SpO2:  [93 %-99 %] 94 % (03/22 9562) Weight:  [81.6 kg] 81.6 kg (03/22 0628) Last BM Date : 02/01/24  Intake/Output from previous day: 03/21 0701 - 03/22 0700 In: 1000 [IV Piggyback:1000] Out: -  Intake/Output this shift: Total I/O In: 1000 [IV Piggyback:1000] Out: -   General appearance: alert and cooperative Resp: clear to auscultation bilaterally GI: soft, tender RLQ, no generalized peritonitis Extremities: no sig edema  Lab Results: CBC  Recent Labs    02/01/24 1649 02/02/24 0420  WBC 10.3 12.1*  HGB 14.2 13.4  HCT 44.4 40.5  PLT 242 233   BMET Recent Labs    02/01/24 1649 02/02/24 0420  NA 136 132*  K 4.1 4.2  CL 100 102  CO2 23 26  GLUCOSE 100* 102*  BUN 15 11  CREATININE 0.92 0.87  CALCIUM 9.9 8.5*   PT/INR No results for input(s): "LABPROT", "INR" in the last 72 hours. ABG No results for input(s): "PHART", "HCO3" in the last 72 hours.  Invalid input(s): "PCO2", "PO2"  Studies/Results: CT ABDOMEN PELVIS W CONTRAST Result Date: 02/01/2024 CLINICAL DATA:  Right lower quadrant abdominal pain. EXAM: CT ABDOMEN AND PELVIS WITH CONTRAST TECHNIQUE: Multidetector CT imaging of the abdomen and pelvis was performed using the standard protocol following bolus administration of intravenous contrast. RADIATION DOSE REDUCTION: This exam was performed according to the departmental dose-optimization program which includes automated exposure control, adjustment of the mA and/or kV according to patient size and/or use of iterative  reconstruction technique. CONTRAST:  75mL OMNIPAQUE IOHEXOL 350 MG/ML SOLN COMPARISON:  None Available. FINDINGS: Lower chest: No acute abnormality. Hepatobiliary: No focal liver abnormality is seen. No gallstones, gallbladder wall thickening, or biliary dilatation. Pancreas: Unremarkable. No pancreatic ductal dilatation or surrounding inflammatory changes. Spleen: Normal in size without focal abnormality. Adrenals/Urinary Tract: Adrenal glands are unremarkable. Kidneys are normal, without renal calculi, focal lesion, or hydronephrosis. Bladder is unremarkable. Stomach/Bowel: Stomach is within normal limits. An 8 mm appendicolith is seen within the distal lumen of a markedly inflamed appendix. There is no evidence of associated perforation or abscess. No evidence of bowel wall thickening, distention, or inflammatory changes. Vascular/Lymphatic: No significant vascular findings are present. No enlarged abdominal or pelvic lymph nodes. Reproductive: Status post hysterectomy. No adnexal masses. Other: There is a small right sided fat containing paraumbilical hernia. No abdominopelvic ascites. Musculoskeletal: No acute or significant osseous findings. IMPRESSION: Acute appendicitis. Electronically Signed   By: Aram Candela M.D.   On: 02/01/2024 20:18    Anti-infectives: Anti-infectives (From admission, onward)    Start     Dose/Rate Route Frequency Ordered Stop   02/02/24 0400  [MAR Hold]  piperacillin-tazobactam (ZOSYN) IVPB 3.375 g        (MAR Hold since Sat 02/02/2024 at 0627.Hold Reason: Transfer to a Procedural area)   3.375 g 12.5 mL/hr over 240 Minutes Intravenous Every 8 hours 02/01/24 2138 02/09/24 0559   02/01/24 2030  piperacillin-tazobactam (ZOSYN) IVPB 3.375 g        3.375 g 100 mL/hr over 30  Minutes Intravenous  Once 02/01/24 2021 02/01/24 2050       Assessment/Plan: Acute appendicitis - IV Zosyn, for laparoscopic appendectomy. Procedure, risks, and benefits discussed. She agrees. She  will discuss HX post-op N/V with the Anesthesia team.   LOS: 0 days    Violeta Gelinas, MD, MPH, FACS Trauma & General Surgery Use [email or web address] to contact on call provider  02/02/2024

## 2024-02-02 NOTE — Anesthesia Preprocedure Evaluation (Addendum)
 Anesthesia Evaluation  Patient identified by MRN, date of birth, ID band Patient awake    Reviewed: Allergy & Precautions, NPO status , Patient's Chart, lab work & pertinent test results  History of Anesthesia Complications (+) PONV and history of anesthetic complications  Airway Mallampati: II  TM Distance: >3 FB Neck ROM: Full    Dental  (+) Dental Advisory Given, Teeth Intact,    Pulmonary former smoker   breath sounds clear to auscultation       Cardiovascular negative cardio ROS  Rhythm:Regular     Neuro/Psych  Headaches, neg Seizures  negative psych ROS   GI/Hepatic Neg liver ROS,,,acute appendicitis   Endo/Other  negative endocrine ROS    Renal/GU negative Renal ROSLab Results      Component                Value               Date                      NA                       132 (L)             02/02/2024                K                        4.2                 02/02/2024                CO2                      26                  02/02/2024                GLUCOSE                  102 (H)             02/02/2024                BUN                      11                  02/02/2024                CREATININE               0.87                02/02/2024                CALCIUM                  8.5 (L)             02/02/2024                GFRNONAA                 >60                 02/02/2024  Musculoskeletal  (+) Arthritis ,    Abdominal   Peds  Hematology negative hematology ROS (+) Lab Results      Component                Value               Date                      WBC                      12.1 (H)            02/02/2024                HGB                      13.4                02/02/2024                HCT                      40.5                02/02/2024                MCV                      95.1                02/02/2024                PLT                      233                  02/02/2024              Anesthesia Other Findings   Reproductive/Obstetrics                             Anesthesia Physical Anesthesia Plan  ASA: 2  Anesthesia Plan: General   Post-op Pain Management: Tylenol PO (pre-op)*   Induction:   PONV Risk Score and Plan: 4 or greater and Ondansetron, Dexamethasone, Midazolam, Propofol infusion, Amisulpride and TIVA  Airway Management Planned: Oral ETT  Additional Equipment: None  Intra-op Plan:   Post-operative Plan: Extubation in OR  Informed Consent: I have reviewed the patients History and Physical, chart, labs and discussed the procedure including the risks, benefits and alternatives for the proposed anesthesia with the patient or authorized representative who has indicated his/her understanding and acceptance.     Dental advisory given  Plan Discussed with: CRNA  Anesthesia Plan Comments:         Anesthesia Quick Evaluation

## 2024-02-02 NOTE — Anesthesia Postprocedure Evaluation (Signed)
 Anesthesia Post Note  Patient: Christina Montoya  Procedure(s) Performed: APPENDECTOMY, LAPAROSCOPIC     Patient location during evaluation: PACU Anesthesia Type: General Level of consciousness: awake and alert Pain management: pain level controlled Vital Signs Assessment: post-procedure vital signs reviewed and stable Respiratory status: spontaneous breathing, nonlabored ventilation and respiratory function stable Cardiovascular status: blood pressure returned to baseline and stable Postop Assessment: no apparent nausea or vomiting Anesthetic complications: no   There were no known notable events for this encounter.  Last Vitals:  Vitals:   02/02/24 0930 02/02/24 0936  BP: (!) 98/57 110/71  Pulse: 83 84  Resp: 14 14  Temp:  37.3 C  SpO2: 94% 95%    Last Pain:  Vitals:   02/02/24 0936  TempSrc:   PainSc: 4                  Anaise Sterbenz

## 2024-02-02 NOTE — Plan of Care (Signed)
  Problem: Education: Goal: Knowledge of General Education information will improve Description Including pain rating scale, medication(s)/side effects and non-pharmacologic comfort measures Outcome: Progressing   Problem: Health Behavior/Discharge Planning: Goal: Ability to manage health-related needs will improve Outcome: Progressing   Problem: Clinical Measurements: Goal: Will remain free from infection Outcome: Progressing   Problem: Activity: Goal: Risk for activity intolerance will decrease Outcome: Progressing   Problem: Coping: Goal: Level of anxiety will decrease Outcome: Progressing   Problem: Skin Integrity: Goal: Risk for impaired skin integrity will decrease Outcome: Progressing

## 2024-02-02 NOTE — Anesthesia Procedure Notes (Signed)
 Procedure Name: Intubation Date/Time: 02/02/2024 7:39 AM  Performed by: Gloris Ham, CRNAPre-anesthesia Checklist: Patient identified, Emergency Drugs available, Suction available and Patient being monitored Patient Re-evaluated:Patient Re-evaluated prior to induction Oxygen Delivery Method: Circle System Utilized Preoxygenation: Pre-oxygenation with 100% oxygen Induction Type: IV induction Ventilation: Mask ventilation without difficulty Laryngoscope Size: Miller and 2 Tube type: Oral Number of attempts: 1 Airway Equipment and Method: Stylet and Oral airway Placement Confirmation: ETT inserted through vocal cords under direct vision, positive ETCO2 and breath sounds checked- equal and bilateral Secured at: 20 cm Tube secured with: Tape Dental Injury: Teeth and Oropharynx as per pre-operative assessment

## 2024-02-03 ENCOUNTER — Encounter (HOSPITAL_COMMUNITY): Payer: Self-pay | Admitting: General Surgery

## 2024-02-03 DIAGNOSIS — Z882 Allergy status to sulfonamides status: Secondary | ICD-10-CM | POA: Diagnosis not present

## 2024-02-03 DIAGNOSIS — Z886 Allergy status to analgesic agent status: Secondary | ICD-10-CM | POA: Diagnosis not present

## 2024-02-03 DIAGNOSIS — Z881 Allergy status to other antibiotic agents status: Secondary | ICD-10-CM | POA: Diagnosis not present

## 2024-02-03 DIAGNOSIS — K3532 Acute appendicitis with perforation and localized peritonitis, without abscess: Secondary | ICD-10-CM | POA: Diagnosis present

## 2024-02-03 DIAGNOSIS — Z79899 Other long term (current) drug therapy: Secondary | ICD-10-CM | POA: Diagnosis not present

## 2024-02-03 DIAGNOSIS — K381 Appendicular concretions: Secondary | ICD-10-CM | POA: Diagnosis present

## 2024-02-03 DIAGNOSIS — M1990 Unspecified osteoarthritis, unspecified site: Secondary | ICD-10-CM | POA: Diagnosis present

## 2024-02-03 DIAGNOSIS — G43909 Migraine, unspecified, not intractable, without status migrainosus: Secondary | ICD-10-CM | POA: Diagnosis present

## 2024-02-03 DIAGNOSIS — R1031 Right lower quadrant pain: Secondary | ICD-10-CM | POA: Diagnosis present

## 2024-02-03 DIAGNOSIS — Z87891 Personal history of nicotine dependence: Secondary | ICD-10-CM | POA: Diagnosis not present

## 2024-02-03 DIAGNOSIS — Z90711 Acquired absence of uterus with remaining cervical stump: Secondary | ICD-10-CM | POA: Diagnosis not present

## 2024-02-03 DIAGNOSIS — Z888 Allergy status to other drugs, medicaments and biological substances status: Secondary | ICD-10-CM | POA: Diagnosis not present

## 2024-02-03 LAB — BASIC METABOLIC PANEL
Anion gap: 8 (ref 5–15)
BUN: 10 mg/dL (ref 6–20)
CO2: 25 mmol/L (ref 22–32)
Calcium: 8.3 mg/dL — ABNORMAL LOW (ref 8.9–10.3)
Chloride: 103 mmol/L (ref 98–111)
Creatinine, Ser: 0.82 mg/dL (ref 0.44–1.00)
GFR, Estimated: >60 mL/min (ref >60)
Glucose, Bld: 99 mg/dL (ref 70–99)
Potassium: 3.4 mmol/L — ABNORMAL LOW (ref 3.5–5.1)
Sodium: 136 mmol/L (ref 135–145)

## 2024-02-03 LAB — CBC
HCT: 36.6 % (ref 36.0–46.0)
Hemoglobin: 11.7 g/dL — ABNORMAL LOW (ref 12.0–15.0)
MCH: 30.9 pg (ref 26.0–34.0)
MCHC: 32 g/dL (ref 30.0–36.0)
MCV: 96.6 fL (ref 80.0–100.0)
Platelets: 178 10*3/uL (ref 150–400)
RBC: 3.79 MIL/uL — ABNORMAL LOW (ref 3.87–5.11)
RDW: 12.7 % (ref 11.5–15.5)
WBC: 7.7 10*3/uL (ref 4.0–10.5)
nRBC: 0 % (ref 0.0–0.2)

## 2024-02-03 NOTE — Progress Notes (Signed)
 Acute appendicitis  Subjective: Feels good, min pain overnight. Tolerating a diet  Objective: Vital signs in last 24 hours: Temp:  [97.5 F (36.4 C)-99.2 F (37.3 C)] 97.5 F (36.4 C) (03/23 0736) Pulse Rate:  [77-85] 77 (03/23 0736) Resp:  [13-20] 18 (03/23 0736) BP: (92-117)/(55-74) 98/63 (03/23 0736) SpO2:  [91 %-97 %] 95 % (03/23 0736) Last BM Date : 02/01/24  Intake/Output from previous day: 03/22 0701 - 03/23 0700 In: 1216 [I.V.:1216] Out: 5 [Blood:5] Intake/Output this shift: No intake/output data recorded.  General appearance: alert and cooperative GI: soft, appropriately TTP Incisions clean, dry, intact Lab Results:  Results for orders placed or performed during the hospital encounter of 02/01/24 (from the past 24 hours)  CBC     Status: Abnormal   Collection Time: 02/03/24  6:08 AM  Result Value Ref Range   WBC 7.7 4.0 - 10.5 K/uL   RBC 3.79 (L) 3.87 - 5.11 MIL/uL   Hemoglobin 11.7 (L) 12.0 - 15.0 g/dL   HCT 86.5 78.4 - 69.6 %   MCV 96.6 80.0 - 100.0 fL   MCH 30.9 26.0 - 34.0 pg   MCHC 32.0 30.0 - 36.0 g/dL   RDW 29.5 28.4 - 13.2 %   Platelets 178 150 - 400 K/uL   nRBC 0.0 0.0 - 0.2 %  Basic metabolic panel     Status: Abnormal   Collection Time: 02/03/24  6:08 AM  Result Value Ref Range   Sodium 136 135 - 145 mmol/L   Potassium 3.4 (L) 3.5 - 5.1 mmol/L   Chloride 103 98 - 111 mmol/L   CO2 25 22 - 32 mmol/L   Glucose, Bld 99 70 - 99 mg/dL   BUN 10 6 - 20 mg/dL   Creatinine, Ser 4.40 0.44 - 1.00 mg/dL   Calcium 8.3 (L) 8.9 - 10.3 mg/dL   GFR, Estimated >10 >27 mL/min   Anion gap 8 5 - 15     Studies/Results Radiology     MEDS, Scheduled  acetaminophen  1,000 mg Oral Q6H   enoxaparin (LOVENOX) injection  40 mg Subcutaneous Q24H   mupirocin ointment  1 Application Nasal BID     Assessment: Acute appendicitis S/p Lap Appy.  POD 1 BT for focal perforation  Plan: Cont IV abx for 24 more hrs then switch to Augmetin for 5d Plan for d/c  tomorrow     LOS: 0 days    Vanita Panda, MD [facility], [place]    02/03/2024 8:15 AM

## 2024-02-04 MED ORDER — OXYCODONE HCL 5 MG PO TABS: 5.0000 mg | ORAL_TABLET | Freq: Four times a day (QID) | ORAL | 0 refills | Status: AC | PRN

## 2024-02-04 MED ORDER — ACETAMINOPHEN 500 MG PO TABS: 1000.0000 mg | ORAL_TABLET | Freq: Four times a day (QID) | ORAL | Status: AC | PRN

## 2024-02-04 MED ORDER — AMOXICILLIN-POT CLAVULANATE 875-125 MG PO TABS: 1.0000 | ORAL_TABLET | Freq: Two times a day (BID) | ORAL | 0 refills | Status: AC

## 2024-02-04 NOTE — Discharge Instructions (Signed)

## 2024-02-04 NOTE — Plan of Care (Signed)

## 2024-02-04 NOTE — Discharge Summary (Signed)
    Patient ID: Christina Montoya [id number] Sep 21, 1966 58 y.o.  Admit date: 02/01/2024 Discharge date: 02/04/2024  Admitting Diagnosis: Acute appendicitis  Discharge Diagnosis Patient Active Problem List   Diagnosis Date Noted   Acute appendicitis 02/01/2024   Knee pain 01/13/2013   Knee pain, bilateral 01/13/2013  Acute perforated appendicitis, s/p lap appy  Consultants none  Reason for Admission: Christina Montoya is an 58 y.o. female with history of partial hysterectomy for fibroids who presents with abdominal pain.   Patient states abdominal pain present x 2 days. Reports nausea without emesis. Normal bowel function. Last BM earlier today. No fevers/chills.    No leukocytosis on labs. CT scan shows acutely inflamed appendix with appendicolith. No evidence of perforation or abscess.  Procedures Lap appy, Dr. Janee Morn, 3/22  Hospital Course:  The patient was admitted and underwent a laparoscopic appendectomy.  The patient tolerated the procedure well.  On POD 2, the patient was tolerating a regular diet, voiding well, mobilizing, and pain was controlled with oral pain medications.  She stayed for an extra day for additional IV abx due to perforation.The patient was stable for DC home at this time with appropriate follow up made.  She was also sent home on 5 days total of Augmentin.   Physical Exam: Abd: soft, appropriately tender, +BS, ND, incisions c/d/i  Allergies as of 02/04/2024       Reactions   Nsaids Other (See Comments)   heartburn   Meperidine Hcl    Other Reaction(s): involuntary muscle spasms, red urine   Septra [sulfamethoxazole-trimethoprim] Hives, Other (See Comments)   Pt is not sure she is allergic to these meds.   Tetracycline Hcl Hives, Rash        Medication List     TAKE these medications    acetaminophen 500 MG tablet Commonly known as: TYLENOL Take 2 tablets (1,000 mg total) by mouth every 6 (six) hours as needed.   ALPRAZolam 1 MG  tablet Commonly known as: XANAX Take 1 mg by mouth as needed for sleep or anxiety.   amoxicillin-clavulanate 875-125 MG tablet Commonly known as: AUGMENTIN Take 1 tablet by mouth 2 (two) times daily for 5 days.   cetirizine 10 MG tablet Commonly known as: ZYRTEC Take 1 tablet (10 mg total) by mouth daily.   oxyCODONE 5 MG immediate release tablet Commonly known as: Oxy IR/ROXICODONE Take 1 tablet (5 mg total) by mouth every 6 (six) hours as needed.   sertraline 100 MG tablet Commonly known as: ZOLOFT 150 mg at bedtime.          Follow-up Information     Maczis, Hedda Slade, PA-C Follow up in 3 week(s).   Specialty: General Surgery Why: Office will call you with a follow up appointment, If you don't hear from the office, please call, Arrive 30 minutes prior to your appointment time, Please bring your insurance card and photo ID Contact information: [address] [place] [address] [facility] [place] [address] [phone]                 Signed: Barnetta Chapel, [facility] Surgery 02/04/2024, 8:43 AM Please see Amion for pager number during day hours 7:00am-4:30pm, 7-11:30am on Weekends

## 2024-02-05 LAB — SURGICAL PATHOLOGY

## 2024-04-17 DIAGNOSIS — R748 Abnormal levels of other serum enzymes: Secondary | ICD-10-CM | POA: Diagnosis not present

## 2024-04-17 DIAGNOSIS — Z1211 Encounter for screening for malignant neoplasm of colon: Secondary | ICD-10-CM | POA: Diagnosis not present

## 2024-04-17 DIAGNOSIS — E7800 Pure hypercholesterolemia, unspecified: Secondary | ICD-10-CM | POA: Diagnosis not present

## 2024-04-17 DIAGNOSIS — E559 Vitamin D deficiency, unspecified: Secondary | ICD-10-CM | POA: Diagnosis not present

## 2024-04-17 DIAGNOSIS — Z0000 Encounter for general adult medical examination without abnormal findings: Secondary | ICD-10-CM | POA: Diagnosis not present

## 2024-10-17 DIAGNOSIS — M25552 Pain in left hip: Secondary | ICD-10-CM | POA: Diagnosis not present
